# Patient Record
Sex: Female | Born: 1955
Health system: Southern US, Community
[De-identification: ages and names within clinical notes are randomized; demographics above are authoritative.]

## PROBLEM LIST (undated history)

## (undated) DIAGNOSIS — E079 Disorder of thyroid, unspecified: Secondary | ICD-10-CM

## (undated) DIAGNOSIS — L719 Rosacea, unspecified: Secondary | ICD-10-CM

## (undated) HISTORY — PX: OTHER SURGICAL HISTORY: SHX169

## (undated) HISTORY — PX: ECTOPIC PREGNANCY SURGERY: SHX613

## (undated) HISTORY — DX: Rosacea, unspecified: L71.9

---

## 2001-08-16 ENCOUNTER — Ambulatory Visit (HOSPITAL_COMMUNITY): Admission: RE | Admit: 2001-08-16 | Discharge: 2001-08-16 | Payer: Self-pay | Admitting: Specialist

## 2011-01-25 ENCOUNTER — Other Ambulatory Visit: Payer: Self-pay | Admitting: Endocrinology

## 2011-01-25 DIAGNOSIS — E049 Nontoxic goiter, unspecified: Secondary | ICD-10-CM

## 2011-02-02 ENCOUNTER — Inpatient Hospital Stay: Admission: RE | Admit: 2011-02-02 | Payer: Self-pay | Source: Ambulatory Visit

## 2015-04-16 ENCOUNTER — Emergency Department (HOSPITAL_BASED_OUTPATIENT_CLINIC_OR_DEPARTMENT_OTHER)
Admission: EM | Admit: 2015-04-16 | Discharge: 2015-04-16 | Disposition: A | Discharge status: Home / Self Care | Payer: Self-pay | Attending: Emergency Medicine | Admitting: Emergency Medicine

## 2015-04-16 ENCOUNTER — Encounter (HOSPITAL_BASED_OUTPATIENT_CLINIC_OR_DEPARTMENT_OTHER): Payer: Self-pay | Admitting: Emergency Medicine

## 2015-04-16 DIAGNOSIS — S199XXA Unspecified injury of neck, initial encounter: Secondary | ICD-10-CM | POA: Insufficient documentation

## 2015-04-16 DIAGNOSIS — Y998 Other external cause status: Secondary | ICD-10-CM | POA: Insufficient documentation

## 2015-04-16 DIAGNOSIS — S3992XA Unspecified injury of lower back, initial encounter: Secondary | ICD-10-CM | POA: Insufficient documentation

## 2015-04-16 DIAGNOSIS — S4992XA Unspecified injury of left shoulder and upper arm, initial encounter: Secondary | ICD-10-CM | POA: Insufficient documentation

## 2015-04-16 DIAGNOSIS — Y9289 Other specified places as the place of occurrence of the external cause: Secondary | ICD-10-CM | POA: Insufficient documentation

## 2015-04-16 DIAGNOSIS — W010XXA Fall on same level from slipping, tripping and stumbling without subsequent striking against object, initial encounter: Secondary | ICD-10-CM | POA: Insufficient documentation

## 2015-04-16 DIAGNOSIS — S79922A Unspecified injury of left thigh, initial encounter: Secondary | ICD-10-CM | POA: Insufficient documentation

## 2015-04-16 DIAGNOSIS — Y9389 Activity, other specified: Secondary | ICD-10-CM | POA: Insufficient documentation

## 2015-04-16 DIAGNOSIS — M62830 Muscle spasm of back: Secondary | ICD-10-CM

## 2015-04-16 MED ORDER — DIAZEPAM 5 MG PO TABS: 5.0000 mg | ORAL_TABLET | Freq: Three times a day (TID) | ORAL | Status: DC | PRN

## 2015-04-16 NOTE — ED Notes (Signed)
Patient called to state that she was given a prescription with a different name than hers.  Name on chart was Pattricia Bossnnie, with an aka of Drinda Buttsnnette.  Corrected name per registration to [facility] per photo copy of her NCDL.  Reprinted prescription per Dr. Silverio LayYao.

## 2015-04-16 NOTE — ED Notes (Signed)
Patient reports that she she is having back spasms and neck pain post fall on monday

## 2015-04-16 NOTE — ED Provider Notes (Signed)
CSN: [id number]     Arrival date & time 04/16/15  0257 History   First MD Initiated Contact with Patient 04/16/15 [phone]     Chief Complaint  Patient presents with  . Back Pain     (Consider location/radiation/quality/duration/timing/severity/associated sxs/prior Treatment) HPI  This is a 59 year old female who slipped on a wet floor 2 mornings ago. She landed on her bottom. She did not have any significant pain immediately but has subsequently developed pain on the left side of her body. She describes the pain as feeling like muscle spasms which occur in paroxysms. They're located in the left side of her neck and trapezius muscle radiating to her left upper extremity, the left paralumbar region and the left anterior thigh. Symptoms are exacerbated by movement or palpation of the affected muscles. Tylenol initially relieved the pain but is no longer effective. Pain is moderate to severe at its worst.  History reviewed. No pertinent past medical history. History reviewed. No pertinent past surgical history. History reviewed. No pertinent family history. History  Substance Use Topics  . Smoking status: Never Smoker   . Smokeless tobacco: Not on file  . Alcohol Use: No   OB History    No data available     Review of Systems  All other systems reviewed and are negative.   Allergies  Review of patient's allergies indicates no known allergies.  Home Medications   Prior to Admission medications   Not on File   BP 122/88 mmHg  Pulse 77  Temp(Src) 98 F (36.7 C) (Oral)  Resp 20  Ht 5\' 4"  (1.626 m)  Wt 170 lb (77.111 kg)  BMI 29.17 kg/m2  SpO2 100%   Physical Exam  General: Well-developed, well-nourished female in no acute distress; appearance consistent with age of record HENT: normocephalic; atraumatic Eyes: pupils equal, round and reactive to light; extraocular muscles intact Neck: supple; left-sided soft tissue tenderness Heart: regular rate and rhythm Lungs: clear to  auscultation bilaterally Abdomen: soft; nondistended; nontender Back: Left paralumbar tenderness; negative straight leg raise Extremities: No deformity; full range of motion; pulses normal; pain on palpation of left anterior thigh muscles and left trapezius Neurologic: Awake, alert and oriented; motor function intact in all extremities and symmetric; no facial droop Skin: Warm and dry Psychiatric: Normal mood and affect    ED Course  Procedures (including critical care time)   MDM     Paula LibraJohn Babbie Dondlinger, MD 04/16/15 [id number]

## 2015-05-29 ENCOUNTER — Encounter (HOSPITAL_BASED_OUTPATIENT_CLINIC_OR_DEPARTMENT_OTHER): Payer: Self-pay

## 2015-05-29 ENCOUNTER — Emergency Department (HOSPITAL_BASED_OUTPATIENT_CLINIC_OR_DEPARTMENT_OTHER): Payer: Self-pay

## 2015-05-29 ENCOUNTER — Emergency Department (HOSPITAL_BASED_OUTPATIENT_CLINIC_OR_DEPARTMENT_OTHER)
Admission: EM | Admit: 2015-05-29 | Discharge: 2015-05-29 | Disposition: A | Discharge status: Home / Self Care | Payer: Self-pay | Attending: Emergency Medicine | Admitting: Emergency Medicine

## 2015-05-29 DIAGNOSIS — Y92410 Unspecified street and highway as the place of occurrence of the external cause: Secondary | ICD-10-CM | POA: Insufficient documentation

## 2015-05-29 DIAGNOSIS — S39012A Strain of muscle, fascia and tendon of lower back, initial encounter: Secondary | ICD-10-CM | POA: Insufficient documentation

## 2015-05-29 DIAGNOSIS — Y9389 Activity, other specified: Secondary | ICD-10-CM | POA: Insufficient documentation

## 2015-05-29 DIAGNOSIS — Y998 Other external cause status: Secondary | ICD-10-CM | POA: Insufficient documentation

## 2015-05-29 DIAGNOSIS — S199XXA Unspecified injury of neck, initial encounter: Secondary | ICD-10-CM | POA: Insufficient documentation

## 2015-05-29 MED ORDER — IBUPROFEN 800 MG PO TABS
800.0000 mg | ORAL_TABLET | Freq: Once | ORAL | Status: AC
  Administered 2015-05-29: 800 mg via ORAL
  Filled 2015-05-29: qty 1

## 2015-05-29 MED ORDER — IBUPROFEN 800 MG PO TABS: 800.0000 mg | ORAL_TABLET | Freq: Three times a day (TID) | ORAL | Status: DC

## 2015-05-29 MED ORDER — CYCLOBENZAPRINE HCL 10 MG PO TABS
5.0000 mg | ORAL_TABLET | Freq: Once | ORAL | Status: AC
  Administered 2015-05-29: 5 mg via ORAL
  Filled 2015-05-29: qty 1

## 2015-05-29 MED ORDER — CYCLOBENZAPRINE HCL 10 MG PO TABS: 10.0000 mg | ORAL_TABLET | Freq: Two times a day (BID) | ORAL | Status: DC | PRN
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
## 2015-05-29 NOTE — ED Notes (Signed)
MD at bedside. 
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
## 2015-05-29 NOTE — Discharge Instructions (Signed)

## 2015-06-05 ENCOUNTER — Encounter (HOSPITAL_BASED_OUTPATIENT_CLINIC_OR_DEPARTMENT_OTHER): Payer: Self-pay | Admitting: *Deleted

## 2015-06-05 ENCOUNTER — Emergency Department (HOSPITAL_BASED_OUTPATIENT_CLINIC_OR_DEPARTMENT_OTHER)
Admission: EM | Admit: 2015-06-05 | Discharge: 2015-06-05 | Disposition: A | Discharge status: Home / Self Care | Payer: Self-pay | Attending: Emergency Medicine | Admitting: Emergency Medicine

## 2015-06-05 DIAGNOSIS — Z87828 Personal history of other (healed) physical injury and trauma: Secondary | ICD-10-CM | POA: Insufficient documentation

## 2015-06-05 DIAGNOSIS — M25511 Pain in right shoulder: Secondary | ICD-10-CM | POA: Insufficient documentation

## 2015-06-05 DIAGNOSIS — M5432 Sciatica, left side: Secondary | ICD-10-CM | POA: Insufficient documentation

## 2015-06-05 DIAGNOSIS — Z791 Long term (current) use of non-steroidal anti-inflammatories (NSAID): Secondary | ICD-10-CM | POA: Insufficient documentation

## 2015-06-05 MED ORDER — DIAZEPAM 5 MG PO TABS: 5.0000 mg | ORAL_TABLET | Freq: Three times a day (TID) | ORAL | Status: DC | PRN

## 2015-06-05 MED ORDER — OXYCODONE-ACETAMINOPHEN 5-325 MG PO TABS: 1.0000 | ORAL_TABLET | Freq: Four times a day (QID) | ORAL | Status: DC | PRN

## 2015-06-05 NOTE — ED Notes (Signed)
MD at bedside. 
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
## 2015-11-22 ENCOUNTER — Encounter (HOSPITAL_BASED_OUTPATIENT_CLINIC_OR_DEPARTMENT_OTHER): Payer: Self-pay | Admitting: Emergency Medicine

## 2015-11-22 ENCOUNTER — Emergency Department (HOSPITAL_BASED_OUTPATIENT_CLINIC_OR_DEPARTMENT_OTHER)
Admission: EM | Admit: 2015-11-22 | Discharge: 2015-11-23 | Disposition: A | Discharge status: Home / Self Care | Payer: Self-pay | Attending: Emergency Medicine | Admitting: Emergency Medicine

## 2015-11-22 DIAGNOSIS — Z791 Long term (current) use of non-steroidal anti-inflammatories (NSAID): Secondary | ICD-10-CM | POA: Insufficient documentation

## 2015-11-22 DIAGNOSIS — N898 Other specified noninflammatory disorders of vagina: Secondary | ICD-10-CM | POA: Insufficient documentation

## 2015-11-22 LAB — WET PREP, GENITAL
CLUE CELLS WET PREP: NONE SEEN
Trich, Wet Prep: NONE SEEN
Yeast Wet Prep HPF POC: NONE SEEN

## 2015-11-22 LAB — URINALYSIS, ROUTINE W REFLEX MICROSCOPIC
BILIRUBIN URINE: NEGATIVE
Glucose, UA: NEGATIVE mg/dL
Hgb urine dipstick: NEGATIVE
KETONES UR: NEGATIVE mg/dL
Nitrite: NEGATIVE
PH: 6.5 (ref 5.0–8.0)
Protein, ur: NEGATIVE mg/dL
SPECIFIC GRAVITY, URINE: 1.024 (ref 1.005–1.030)

## 2015-11-22 LAB — URINE MICROSCOPIC-ADD ON

## 2015-11-22 MED ORDER — CEFTRIAXONE SODIUM 250 MG IJ SOLR
250.0000 mg | Freq: Once | INTRAMUSCULAR | Status: AC
  Administered 2015-11-22: 250 mg via INTRAMUSCULAR
  Filled 2015-11-22: qty 250

## 2015-11-22 MED ORDER — LIDOCAINE HCL (PF) 1 % IJ SOLN
INTRAMUSCULAR | Status: AC
  Administered 2015-11-22: 5 mL
  Filled 2015-11-22: qty 5

## 2015-11-22 MED ORDER — AZITHROMYCIN 250 MG PO TABS
1000.0000 mg | ORAL_TABLET | Freq: Once | ORAL | Status: AC
  Administered 2015-11-22: 1000 mg via ORAL
  Filled 2015-11-22: qty 4

## 2015-11-22 NOTE — ED Provider Notes (Signed)
CSN: [id number]     Arrival date & time 11/22/15  1934 History   First MD Initiated Contact with Patient 11/22/15 2136     Chief Complaint  Patient presents with  . Vaginal Discharge     (Consider location/radiation/quality/duration/timing/severity/associated sxs/prior Treatment) HPI Comments: Patient presents with complaint of milky vaginal discharge over the past 2 weeks. Patient reports having a new sexual partner. She denies pain, fever, nausea, vomiting, diarrhea. She feels a pressure at times with urination, however denies pain or increased frequency/urgency. No hematuria. No treatment prior to arrival. Patient has noticed a vaginal odor. She is concerned about sexual transmitted disease versus urinary tract infection. Denies other complaints. Onset of symptoms acute. Course is constant. Nothing makes symptoms better or worse.  The history is provided by the patient.    History reviewed. No pertinent past medical history. Past Surgical History  Procedure Laterality Date  . Tubal preg.     History reviewed. No pertinent family history. Social History  Substance Use Topics  . Smoking status: Never Smoker   . Smokeless tobacco: None  . Alcohol Use: No   OB History    No data available     Review of Systems  Constitutional: Negative for fever.  HENT: Negative for sore throat.   Eyes: Negative for discharge.  Gastrointestinal: Negative for rectal pain.  Genitourinary: Positive for dysuria (pressure) and vaginal discharge. Negative for frequency, hematuria, vaginal bleeding, genital sores, pelvic pain and dyspareunia.  Musculoskeletal: Negative for arthralgias.  Skin: Negative for rash.  Hematological: Negative for adenopathy.   Allergies  Review of patient's allergies indicates no known allergies.  Home Medications   Prior to Admission medications   Medication Sig Start Date End Date Taking? Authorizing Provider  cyclobenzaprine (FLEXERIL) 10 MG tablet Take 1 tablet  (10 mg total) by mouth 2 (two) times daily as needed for muscle spasms. 05/29/15   Glynn Octave, MD  diazepam (VALIUM) 5 MG tablet Take 1 tablet (5 mg total) by mouth every 8 (eight) hours as needed for anxiety (spasms). 06/05/15   Purvis Sheffield, MD  ibuprofen (ADVIL,MOTRIN) 800 MG tablet Take 1 tablet (800 mg total) by mouth 3 (three) times daily. 05/29/15   Glynn Octave, MD  oxyCODONE-acetaminophen (PERCOCET) 5-325 MG per tablet Take 1 tablet by mouth every 6 (six) hours as needed. 06/05/15   Purvis Sheffield, MD   BP 143/86 mmHg  Pulse 63  Temp(Src) 97.8 F (36.6 C) (Oral)  Resp 18  Ht 5\' 4"  (1.626 m)  Wt 79.833 kg  BMI 30.20 kg/m2  SpO2 100%   Physical Exam  Constitutional: She appears well-developed and well-nourished.  HENT:  Head: Normocephalic and atraumatic.  Eyes: Conjunctivae are normal. Right eye exhibits no discharge. Left eye exhibits no discharge.  Neck: Normal range of motion. Neck supple.  Cardiovascular: Normal rate, regular rhythm and normal heart sounds.   Pulmonary/Chest: Effort normal and breath sounds normal.  Abdominal: Soft. There is no tenderness.  Genitourinary: There is no rash or tenderness on the right labia. There is no rash or tenderness on the left labia. Uterus is not tender. Cervix exhibits no motion tenderness. Right adnexum displays no mass and no tenderness. Left adnexum displays no mass and no tenderness. No tenderness in the vagina. No signs of injury around the vagina. Vaginal discharge (Milky white) found.  Neurological: She is alert.  Skin: Skin is warm and dry.  Psychiatric: She has a normal mood and affect.  Nursing note and vitals reviewed.  ED Course  Procedures (including critical care time) Labs Review Labs Reviewed  WET PREP, GENITAL - Abnormal; Notable for the following:    WBC, Wet Prep HPF POC MODERATE (*)    All other components within normal limits  URINALYSIS, ROUTINE W REFLEX MICROSCOPIC (NOT AT [facility]) - Abnormal;  Notable for the following:    APPearance CLOUDY (*)    Leukocytes, UA MODERATE (*)    All other components within normal limits  URINE MICROSCOPIC-ADD ON - Abnormal; Notable for the following:    Squamous Epithelial / LPF 6-30 (*)    Bacteria, UA MANY (*)    All other components within normal limits  URINE CULTURE  GC/CHLAMYDIA PROBE AMP ([place]) NOT AT [facility]    Imaging Review No results found. I have personally reviewed and evaluated these images and lab results as part of my medical decision-making.   EKG Interpretation None       10:22 PM Patient seen and examined. Work-up initiated. Medications ordered.   Vital signs reviewed and are as follows: BP 143/86 mmHg  Pulse 63  Temp(Src) 97.8 F (36.6 C) (Oral)  Resp 18  Ht 5\' 4"  (1.626 m)  Wt 79.833 kg  BMI 30.20 kg/m2  SpO2 100%  Pelvic exam performed by Katrinka BlazingSmith PA-S2 under my supervision. Performed with nurse chaperone.   11:04 PM Patient updated on results. Will treat with Rocephin and azithromycin. UA possibly infected but this is not a clean catch. Culture sent. She does not have convincing irritative UTI symptoms, so will withhold antibiotics until culture results.     MDM   Final diagnoses:  Vaginal discharge   Pt with vaginal d/c. New sexual partner. Concern is for cervicitis. No significant pain to suggest PID. No fevers. UA is likely contaminated from vaginal discharge containing WBCs. Treatment as above. Patient appears well, nontoxic. Will discharge to home.    Renne CriglerJoshua Memory Heinrichs, PA-C 11/22/15 2311  Glynn OctaveStephen Rancour, MD 11/22/15 [phone]

## 2015-11-22 NOTE — Discharge Instructions (Signed)
Please read and follow all provided instructions.  Your diagnoses today include:  1. Vaginal discharge     Tests performed today include:  Test for gonorrhea and chlamydia.   Wet prep - only infection fighting cells  Urine culture - pending   Vital signs. See below for your results today.   Medications:  You were treated with azithromycin and rocephin today. These antibiotics treat you for gonorrhea and chlamydia. They do not treat for HIV or syphilis.   Home care instructions:  Read educational materials contained in this packet and follow any instructions provided.   You should tell your partners about your infection and avoid having sex for one week to allow time for the medicine to work.  Sexually transmitted disease testing also available at:   [facility] [place]  [address], [place], phone [phone] or [phone]    Monday - Friday, call for an appointment  [facility] [place]  [address], [place], phone [phone] or [phone]   Monday - Friday, call for an appointment  Return instructions:   Please return to the Emergency Department if you experience worsening symptoms.   Please return if you have any other emergent concerns.  Additional Information:  Your vital signs today were: BP 143/86 mmHg   Pulse 63   Temp(Src) 97.8 F (36.6 C) (Oral)   Resp 18   Ht 5\' 4"  (1.626 m)   Wt 79.833 kg   BMI 30.20 kg/m2   SpO2 100% If your blood pressure (BP) was elevated above 135/85 this visit, please have this repeated by your doctor within one month. --------------

## 2015-11-22 NOTE — ED Notes (Addendum)
[facility] Chaperone present.

## 2015-11-22 NOTE — ED Notes (Signed)
Patient states that she recently started to have sexual intercourse again and since has noticed a strong odor to her urine and a milky d/c to her vaginal secretions.

## 2015-11-22 NOTE — ED Notes (Addendum)
C/o some dysuria, recent resolved hematuria, vaginal d/c, intermittent pelvic and lower back pressure (denies: bleeding, nvd, fever or dizziness). Sx worse with intercourse.

## 2015-11-24 ENCOUNTER — Telehealth (HOSPITAL_BASED_OUTPATIENT_CLINIC_OR_DEPARTMENT_OTHER): Payer: Self-pay | Admitting: Emergency Medicine

## 2015-11-24 LAB — GC/CHLAMYDIA PROBE AMP (~~LOC~~) NOT AT ARMC
Chlamydia: NEGATIVE
Neisseria Gonorrhea: NEGATIVE

## 2015-11-25 LAB — URINE CULTURE: Culture: >=100000

## 2015-11-26 ENCOUNTER — Telehealth (HOSPITAL_COMMUNITY): Payer: Self-pay

## 2015-11-26 NOTE — Progress Notes (Signed)
ED Antimicrobial Stewardship Positive Culture Follow Up   Elizabeth Vaughn is an 60 y.o. female who [facility] with a chief complaint of  Chief Complaint  Patient presents with  . Vaginal Discharge    Recent Results (from the past 720 hour(s))  Urine culture     Status: None   Collection Time: 11/22/15  7:45 PM  Result Value Ref Range Status   Specimen Description URINE, RANDOM  Final   Special Requests NONE  Final   Culture   Final    >=100,000 COLONIES/mL ESCHERICHIA COLI Performed at [facility]    Report Status 11/25/2015 FINAL  Final   Organism ID, Bacteria ESCHERICHIA COLI  Final      Susceptibility   Escherichia coli - MIC*    AMPICILLIN >=32 RESISTANT Resistant     CEFAZOLIN <=4 SENSITIVE Sensitive     CEFTRIAXONE <=1 SENSITIVE Sensitive     CIPROFLOXACIN >=4 RESISTANT Resistant     GENTAMICIN >=16 RESISTANT Resistant     IMIPENEM <=0.25 SENSITIVE Sensitive     NITROFURANTOIN <=16 SENSITIVE Sensitive     TRIMETH/SULFA <=20 SENSITIVE Sensitive     AMPICILLIN/SULBACTAM 16 INTERMEDIATE Intermediate     PIP/TAZO <=4 SENSITIVE Sensitive     * >=100,000 COLONIES/mL ESCHERICHIA COLI  Wet prep, genital     Status: Abnormal   Collection Time: 11/22/15 10:26 PM  Result Value Ref Range Status   Yeast Wet Prep HPF POC NONE SEEN NONE SEEN Final   Trich, Wet Prep NONE SEEN NONE SEEN Final   Clue Cells Wet Prep HPF POC NONE SEEN NONE SEEN Final   WBC, Wet Prep HPF POC MODERATE (A) NONE SEEN Final   Sperm PRESENT  Final    Signs/symptoms most likely not representative of UTI. No treatment--likely asymptomatic bacteriuria.  ED Provider: Alton Revere, PharmD Candidate.

## 2015-11-26 NOTE — Telephone Encounter (Signed)
Post ED Visit - Positive Culture Follow-up  Culture report reviewed by antimicrobial stewardship pharmacist:   Enzo Bi, Pharm.D.  Celedonio Miyamoto, Pharm.D., BCPS  Garvin Fila, Pharm.D.  Georgina Pillion, Pharm.D., BCPS  [place], [address].D., BCPS, AAHIVP  Estella Husk, Pharm.D., BCPS, AAHIVP  Tennis Must, Pharm.D.  Sherle Poe, [address].DJacinto Reap, Pharm.D.  Positive urine culture, >/= 100,000 colonies -> E Coli Chart reviewed by Laveda Norman PA "No treatment"  Arvid Right 11/26/2015, 11:14 AM

## 2016-03-07 ENCOUNTER — Encounter (HOSPITAL_BASED_OUTPATIENT_CLINIC_OR_DEPARTMENT_OTHER): Payer: Self-pay | Admitting: *Deleted

## 2016-03-07 ENCOUNTER — Emergency Department (HOSPITAL_BASED_OUTPATIENT_CLINIC_OR_DEPARTMENT_OTHER): Payer: No Typology Code available for payment source

## 2016-03-07 ENCOUNTER — Emergency Department (HOSPITAL_BASED_OUTPATIENT_CLINIC_OR_DEPARTMENT_OTHER)
Admission: EM | Admit: 2016-03-07 | Discharge: 2016-03-07 | Disposition: A | Discharge status: Home / Self Care | Payer: No Typology Code available for payment source | Attending: Emergency Medicine | Admitting: Emergency Medicine

## 2016-03-07 DIAGNOSIS — S8002XA Contusion of left knee, initial encounter: Secondary | ICD-10-CM | POA: Insufficient documentation

## 2016-03-07 DIAGNOSIS — Y92410 Unspecified street and highway as the place of occurrence of the external cause: Secondary | ICD-10-CM | POA: Insufficient documentation

## 2016-03-07 DIAGNOSIS — H9203 Otalgia, bilateral: Secondary | ICD-10-CM | POA: Insufficient documentation

## 2016-03-07 DIAGNOSIS — R51 Headache: Secondary | ICD-10-CM | POA: Insufficient documentation

## 2016-03-07 DIAGNOSIS — M549 Dorsalgia, unspecified: Secondary | ICD-10-CM | POA: Insufficient documentation

## 2016-03-07 DIAGNOSIS — Y999 Unspecified external cause status: Secondary | ICD-10-CM | POA: Insufficient documentation

## 2016-03-07 DIAGNOSIS — S60012A Contusion of left thumb without damage to nail, initial encounter: Secondary | ICD-10-CM | POA: Insufficient documentation

## 2016-03-07 DIAGNOSIS — Y939 Activity, unspecified: Secondary | ICD-10-CM | POA: Insufficient documentation

## 2016-03-07 DIAGNOSIS — R1084 Generalized abdominal pain: Secondary | ICD-10-CM | POA: Insufficient documentation

## 2016-03-07 DIAGNOSIS — M542 Cervicalgia: Secondary | ICD-10-CM | POA: Insufficient documentation

## 2016-03-07 DIAGNOSIS — S2002XA Contusion of left breast, initial encounter: Secondary | ICD-10-CM | POA: Insufficient documentation

## 2016-03-07 DIAGNOSIS — S8001XA Contusion of right knee, initial encounter: Secondary | ICD-10-CM | POA: Insufficient documentation
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
## 2016-03-07 NOTE — ED Notes (Signed)
Dr. Criss AlvineGoldston in to speak with pt, updated with plan.
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
## 2016-03-07 NOTE — ED Notes (Signed)
Xray results reviewed.
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
## 2016-03-07 NOTE — ED Notes (Signed)
Pt to radiology now. Pt alert, NAD,calm, interactive, resps e/u, speaking in clear complete sentences, no dyspnea noted. Updated.   

## 2016-04-15 ENCOUNTER — Emergency Department (HOSPITAL_BASED_OUTPATIENT_CLINIC_OR_DEPARTMENT_OTHER)
Admission: EM | Admit: 2016-04-15 | Discharge: 2016-04-15 | Disposition: A | Discharge status: Home / Self Care | Payer: No Typology Code available for payment source | Attending: Emergency Medicine | Admitting: Emergency Medicine

## 2016-04-15 ENCOUNTER — Encounter (HOSPITAL_BASED_OUTPATIENT_CLINIC_OR_DEPARTMENT_OTHER): Payer: Self-pay

## 2016-04-15 DIAGNOSIS — M25562 Pain in left knee: Secondary | ICD-10-CM | POA: Diagnosis not present

## 2016-04-15 MED ORDER — IBUPROFEN 600 MG PO TABS: 600.0000 mg | ORAL_TABLET | Freq: Four times a day (QID) | ORAL | Status: DC | PRN

## 2016-04-15 MED ORDER — KETOROLAC TROMETHAMINE 30 MG/ML IJ SOLN
30.0000 mg | Freq: Once | INTRAMUSCULAR | Status: DC
  Filled 2016-04-15: qty 1

## 2016-04-15 MED ORDER — KETOROLAC TROMETHAMINE 30 MG/ML IJ SOLN
30.0000 mg | Freq: Once | INTRAMUSCULAR | Status: AC
  Administered 2016-04-15: 30 mg via INTRAMUSCULAR
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
## 2016-04-15 NOTE — Discharge Instructions (Signed)
Your knee pain is likely still from bruising and inflammation.   The best treatment for this is physical therapy. We are unable to refer you to the physical therapist from the emergency room. You can try calling the [facility] or a physical therapy office that is close to where you live.  If you need a doctor's order you need to get this from a primary care doctor, or you can call the Sports medicine doctor, Dr. Pearletha ForgeHudnall, whose office is located upstairs from here. His information is included.  For your pain you should focus on doing cold compresses/icing 2-3 times a day for at least 20 minutes. Do range of motion exercises for now until you get in with the physical therapist, they will be able to give you more directed exercises. You can continue ibuprofen 400-600mg  3 times a day or tylenol 500-650mg  4 times a day.
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
## 2016-04-19 ENCOUNTER — Encounter: Payer: Self-pay | Admitting: Family Medicine

## 2016-04-19 ENCOUNTER — Ambulatory Visit (INDEPENDENT_AMBULATORY_CARE_PROVIDER_SITE_OTHER): Payer: Self-pay | Admitting: Family Medicine

## 2016-04-19 VITALS — BP 144/86 | HR 84 | Ht 64.0 in | Wt 170.0 lb

## 2016-04-19 DIAGNOSIS — M25562 Pain in left knee: Secondary | ICD-10-CM

## 2016-04-19 DIAGNOSIS — M25561 Pain in right knee: Secondary | ICD-10-CM

## 2016-04-19 MED ORDER — DICLOFENAC SODIUM 75 MG PO TBEC: 75.0000 mg | DELAYED_RELEASE_TABLET | Freq: Two times a day (BID) | ORAL | Status: DC

## 2016-04-19 NOTE — Patient Instructions (Addendum)
This is most consistent with severe knee contusion but it is unusual it's taking so long for you to recover. Icing 15 minutes at a time 3-4 times a day. Diclofenac 75mg  twice a day with food for pain and inflammation. Start physical therapy. Do home exercises on days you don't go to therapy. See work restrictions. Follow up with me in 1 month to 6 weeks. Consider MRI, injection if not improving.

## 2016-04-21 DIAGNOSIS — M25562 Pain in left knee: Secondary | ICD-10-CM

## 2016-04-21 DIAGNOSIS — M25561 Pain in right knee: Secondary | ICD-10-CM | POA: Insufficient documentation
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
## 2016-04-28 ENCOUNTER — Ambulatory Visit: Payer: No Typology Code available for payment source | Attending: Family Medicine | Admitting: Physical Therapy

## 2016-04-28 ENCOUNTER — Ambulatory Visit: Payer: Self-pay

## 2016-04-28 DIAGNOSIS — R2689 Other abnormalities of gait and mobility: Secondary | ICD-10-CM | POA: Insufficient documentation

## 2016-04-28 DIAGNOSIS — R262 Difficulty in walking, not elsewhere classified: Secondary | ICD-10-CM | POA: Diagnosis present

## 2016-04-28 DIAGNOSIS — M25561 Pain in right knee: Secondary | ICD-10-CM | POA: Diagnosis present

## 2016-04-28 DIAGNOSIS — M25661 Stiffness of right knee, not elsewhere classified: Secondary | ICD-10-CM | POA: Diagnosis present

## 2016-04-28 DIAGNOSIS — M25562 Pain in left knee: Secondary | ICD-10-CM | POA: Insufficient documentation

## 2016-04-28 DIAGNOSIS — M25662 Stiffness of left knee, not elsewhere classified: Secondary | ICD-10-CM
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
## 2016-04-30 ENCOUNTER — Ambulatory Visit: Payer: No Typology Code available for payment source

## 2016-05-03 ENCOUNTER — Ambulatory Visit: Payer: No Typology Code available for payment source | Admitting: Physical Therapy

## 2016-05-03 DIAGNOSIS — M25661 Stiffness of right knee, not elsewhere classified: Secondary | ICD-10-CM

## 2016-05-03 DIAGNOSIS — R2689 Other abnormalities of gait and mobility: Secondary | ICD-10-CM

## 2016-05-03 DIAGNOSIS — M25562 Pain in left knee: Secondary | ICD-10-CM | POA: Diagnosis not present

## 2016-05-03 DIAGNOSIS — R262 Difficulty in walking, not elsewhere classified: Secondary | ICD-10-CM

## 2016-05-03 DIAGNOSIS — M25662 Stiffness of left knee, not elsewhere classified: Secondary | ICD-10-CM

## 2016-05-03 DIAGNOSIS — M25561 Pain in right knee: Secondary | ICD-10-CM

## 2016-05-03 NOTE — Patient Instructions (Signed)
Quad Set    With other leg bent, foot flat, slowly tighten muscles on thigh of straight leg while counting out loud to _5___. Repeat with other leg. Repeat _10___ times. Do __1-2__ sessions per day.  http://gt2.exer.us/276   Copyright  VHI. All rights reserved.    Heel Slide    Bend knee and pull heel toward buttocks. Hold _1-2___ seconds. Return. Repeat with other knee. Repeat __10__ times. Do __1-2__ sessions per day.  http://gt2.exer.us/372   Copyright  VHI. All rights reserved.    Straight Leg Raise    Bend one leg. Raise other leg __5-6__ inches with knee locked. Exhale and tighten thigh muscles while raising leg. Repeat with other leg. Repeat __10__ times. Do _1-2___ sessions per day.  http://gt2.exer.us/270   Copyright  VHI. All rights reserved.   Short Arc Dean Foods CompanyQuad    Place a large can or rolled towel under leg. Straighten knee and leg. Hold _1-2___ seconds. Repeat with other leg. Repeat _10___ times. Do _1-2___ sessions per day.  http://gt2.exer.us/366   Copyright  VHI. All rights reserved.
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
## 2016-05-10 ENCOUNTER — Ambulatory Visit: Payer: Self-pay | Attending: Internal Medicine

## 2016-05-10 DIAGNOSIS — M25661 Stiffness of right knee, not elsewhere classified: Secondary | ICD-10-CM | POA: Insufficient documentation

## 2016-05-10 DIAGNOSIS — R262 Difficulty in walking, not elsewhere classified: Secondary | ICD-10-CM | POA: Insufficient documentation

## 2016-05-10 DIAGNOSIS — M25662 Stiffness of left knee, not elsewhere classified: Secondary | ICD-10-CM | POA: Insufficient documentation

## 2016-05-10 DIAGNOSIS — R2689 Other abnormalities of gait and mobility: Secondary | ICD-10-CM | POA: Insufficient documentation

## 2016-05-10 DIAGNOSIS — M25562 Pain in left knee: Secondary | ICD-10-CM | POA: Insufficient documentation

## 2016-05-10 DIAGNOSIS — M25561 Pain in right knee: Secondary | ICD-10-CM | POA: Insufficient documentation

## 2016-05-12 ENCOUNTER — Ambulatory Visit: Payer: Self-pay | Admitting: Physical Therapy

## 2016-05-12 DIAGNOSIS — M25662 Stiffness of left knee, not elsewhere classified: Secondary | ICD-10-CM

## 2016-05-12 DIAGNOSIS — M25562 Pain in left knee: Secondary | ICD-10-CM

## 2016-05-12 DIAGNOSIS — M25661 Stiffness of right knee, not elsewhere classified: Secondary | ICD-10-CM

## 2016-05-12 DIAGNOSIS — R2689 Other abnormalities of gait and mobility: Secondary | ICD-10-CM

## 2016-05-12 DIAGNOSIS — M25561 Pain in right knee: Secondary | ICD-10-CM

## 2016-05-12 DIAGNOSIS — R262 Difficulty in walking, not elsewhere classified: Secondary | ICD-10-CM
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
## 2016-05-17 ENCOUNTER — Ambulatory Visit: Payer: No Typology Code available for payment source

## 2016-05-20 ENCOUNTER — Ambulatory Visit: Payer: No Typology Code available for payment source

## 2016-05-24 ENCOUNTER — Ambulatory Visit: Payer: Self-pay | Admitting: Physical Therapy

## 2016-05-24 ENCOUNTER — Telehealth: Payer: Self-pay | Admitting: Physical Therapy

## 2016-05-24 DIAGNOSIS — R2689 Other abnormalities of gait and mobility: Secondary | ICD-10-CM

## 2016-05-24 DIAGNOSIS — M25561 Pain in right knee: Secondary | ICD-10-CM

## 2016-05-24 DIAGNOSIS — R262 Difficulty in walking, not elsewhere classified: Secondary | ICD-10-CM

## 2016-05-24 DIAGNOSIS — M25562 Pain in left knee: Secondary | ICD-10-CM

## 2016-05-24 DIAGNOSIS — M25662 Stiffness of left knee, not elsewhere classified: Secondary | ICD-10-CM

## 2016-05-24 DIAGNOSIS — M25661 Stiffness of right knee, not elsewhere classified: Secondary | ICD-10-CM
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
## 2016-05-24 NOTE — Patient Instructions (Signed)
Adductor Stretch - Supine    Lie on floor, knees bent, feet flat. Keeping feet together, lower knees toward floor until stretch felt at inner thighs.  Can do left side only.  Hold 30 seconds. Repeat _2-3__ times. Do ___ times per day.  Copyright  VHI. All rights reserved.    Hip Flexor Stretch    Lying on back near edge of bed, bend one leg, foot flat. Hang other leg over edge, relaxed, thigh resting entirely on bed for __30__ seconds. Repeat _2-3___ times. Do _2-3___ sessions per day. Bend knee back keeping thigh in contact with bed to what you can tolerate.  http://gt2.exer.us/347   Copyright  VHI. All rights reserved.    KNEE: Quadriceps - Prone    Place strap around ankle. Bring ankle toward buttocks. Prop knee up on towel roll for added stretch. Hold _30__ seconds. __2-3_ reps per set, _2-3__ sets per day.   Copyright  VHI. All rights reserved.

## 2016-05-24 NOTE — Telephone Encounter (Signed)
Recommend home TENS unit.  If you agree, please order.  Thanks- Clarita CraneStephanie F Ashlan Dignan, PT, DPT 05/24/2016 10:45 AM

## 2016-05-27 ENCOUNTER — Ambulatory Visit: Payer: No Typology Code available for payment source | Admitting: Physical Therapy

## 2016-05-27 DIAGNOSIS — M25562 Pain in left knee: Secondary | ICD-10-CM

## 2016-05-27 DIAGNOSIS — M25662 Stiffness of left knee, not elsewhere classified: Secondary | ICD-10-CM

## 2016-05-27 DIAGNOSIS — R2689 Other abnormalities of gait and mobility: Secondary | ICD-10-CM

## 2016-05-27 DIAGNOSIS — R262 Difficulty in walking, not elsewhere classified: Secondary | ICD-10-CM

## 2016-05-27 DIAGNOSIS — M25561 Pain in right knee: Secondary | ICD-10-CM

## 2016-05-27 DIAGNOSIS — M25661 Stiffness of right knee, not elsewhere classified: Secondary | ICD-10-CM
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
## 2016-05-31 ENCOUNTER — Ambulatory Visit (INDEPENDENT_AMBULATORY_CARE_PROVIDER_SITE_OTHER): Payer: Self-pay | Admitting: Family Medicine

## 2016-05-31 ENCOUNTER — Ambulatory Visit: Payer: Self-pay | Admitting: Physical Therapy

## 2016-05-31 ENCOUNTER — Encounter: Payer: Self-pay | Admitting: Family Medicine

## 2016-05-31 DIAGNOSIS — R2689 Other abnormalities of gait and mobility: Secondary | ICD-10-CM

## 2016-05-31 DIAGNOSIS — M25661 Stiffness of right knee, not elsewhere classified: Secondary | ICD-10-CM

## 2016-05-31 DIAGNOSIS — M25561 Pain in right knee: Secondary | ICD-10-CM

## 2016-05-31 DIAGNOSIS — M25662 Stiffness of left knee, not elsewhere classified: Secondary | ICD-10-CM

## 2016-05-31 DIAGNOSIS — M25562 Pain in left knee: Secondary | ICD-10-CM

## 2016-05-31 DIAGNOSIS — R262 Difficulty in walking, not elsewhere classified: Secondary | ICD-10-CM

## 2016-05-31 MED ORDER — IBUPROFEN 800 MG PO TABS: 800.0000 mg | ORAL_TABLET | Freq: Three times a day (TID) | ORAL | 1 refills | Status: DC | PRN
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
## 2016-05-31 NOTE — Patient Instructions (Signed)
Finish the last visit of physical therapy then do home exercises for 6 more weeks (or until pain has resolved). You have a thenar muscle strain as well in your thumb. Consider thumbkeeper to rest this as much as possible. Ice or heat, whichever feels better, for 15 minutes 3-4 times a day. Ibuprofen or aleve if needed for pain. Follow up with me in 6 weeks for reevaluation.

## 2016-06-02 ENCOUNTER — Ambulatory Visit: Payer: No Typology Code available for payment source | Admitting: Physical Therapy

## 2016-06-02 DIAGNOSIS — R2689 Other abnormalities of gait and mobility: Secondary | ICD-10-CM

## 2016-06-02 DIAGNOSIS — R262 Difficulty in walking, not elsewhere classified: Secondary | ICD-10-CM

## 2016-06-02 DIAGNOSIS — M25662 Stiffness of left knee, not elsewhere classified: Secondary | ICD-10-CM

## 2016-06-02 DIAGNOSIS — M25561 Pain in right knee: Secondary | ICD-10-CM

## 2016-06-02 DIAGNOSIS — M25562 Pain in left knee: Secondary | ICD-10-CM

## 2016-06-02 DIAGNOSIS — M25661 Stiffness of right knee, not elsewhere classified: Secondary | ICD-10-CM

## 2016-06-02 NOTE — Assessment & Plan Note (Signed)
2/2 severe knee contusions (worse on left), possible underlying arthritis flare also.  Icing, ibuprofen or diclofenac as needed.  Home exercises, can d/c physical therapy now.

## 2016-06-02 NOTE — Patient Instructions (Signed)
ABDUCTION: Standing - Resistance Band (Active)   Stand, feet flat. Against blue resistance band, lift right leg out to side.  Repeat with other leg. Complete _1__ sets of _10__ repetitions. Perform _2-3__ sessions per day.   Strengthening: Hip Flexion - Resisted   With tubing around left ankle, anchor behind, bring leg forward, keeping knee straight.  Repeat with other leg. Repeat __10__ times per set. Do __1__ sets per session. Do __2-3__ sessions per day.  Strengthening: Hip Extension - Resisted   With tubing around right ankle, face anchor and pull leg straight back.  Repeat with other leg.. Repeat __10__ times per set. Do _1___ sets per session. Do __2-3__ sessions per day.        At the gym: -Warm up on the bike, treadmill or elliptical for 5-10 min  -Machines you can use: Leg Press (did 35# in clinic), Knee extension (did 20# in clinic), and Hamstring Curls (did 20# in clinic).  You're looking to do 2-3 sets of 10 reps.  Other machines we don't have but you could use: hip abduction and adduction machines and hip extension machine.  You want to feel some muscle fatigue by the end of your sets.  -Cool down with stretching.

## 2016-06-02 NOTE — Therapy (Signed)
[facility] [address]  [place], [place], [address] Phone: [phone]   Fax:  [phone]  Physical Therapy Treatment  Patient Details  Name: Elizabeth Vaughn MRN: [id number] Date of Birth: 1956/10/08 Referring Provider: Dr. Karlton Lemon  Encounter Date: 06/02/2016      PT End of Session - 06/02/16 0754    Visit Number 7   PT Start Time 0725  pt arrived late   PT Stop Time 0754   PT Time Calculation (min) 29 min   Activity Tolerance Patient tolerated treatment well   Behavior During Therapy [facility] for tasks assessed/performed      No past medical history on file.  Past Surgical History:  Procedure Laterality Date  . ECTOPIC PREGNANCY SURGERY    . tubal preg.      There were no vitals filed for this visit.      Subjective Assessment - 06/02/16 1761    Subjective MD appointment went well; pleased with progress.  Returns in 6 weeks to MD and hopeful this is last appointment.   Limitations Standing;Walking;Sitting;House hold activities   Patient Stated Goals improve pain in knees   Currently in Pain? No/denies   Pain Score 0-No pain  "no just that L knee nag there"   Pain Location Knee   Pain Orientation Left   Pain Descriptors / Indicators Nagging            [facility] PT Assessment - 06/02/16 0759      Observation/Other Assessments   Focus on Therapeutic Outcomes (FOTO)  99 (1% limited)     AROM   Right Knee Extension -3   Right Knee Flexion 120   Left Knee Extension 0   Left Knee Flexion 118                     [facility] Adult PT Treatment/Exercise - 06/02/16 6073      Self-Care   Other Self-Care Comments  educated on updated fitness program, recommendations for gym protocol and HEP; pt verbalized understanding     Knee/Hip Exercises: Aerobic   Nustep L5 x 5 min     Knee/Hip Exercises: Machines for Strengthening   Cybex Knee Extension 20# x20   Cybex Knee Flexion 20# x20   Cybex Leg Press 35# x 20     Knee/Hip Exercises: Standing   Hip Flexion Both;10 reps;Knee straight   Hip Flexion Limitations blue theraband   Hip Abduction Both;10 reps;Knee straight   Abduction Limitations blue theraband   Hip Extension Both;10 reps;Knee straight   Extension Limitations blue theraband   Walking with Sports Cord resisted side stepping and backwards walking with blue theraband                PT Education - 06/02/16 0753    Education provided Yes   Education Details HEP and gym exercises   Person(s) Educated Patient   Methods Explanation   Comprehension Verbalized understanding             PT Long Term Goals - 06/02/16 0755      PT LONG TERM GOAL #1   Title independent with HEP (06/09/16)   Status Achieved     PT LONG TERM GOAL #2   Title improve bil knee AROM flexion to 125 for improved mobility (06/09/16)   Baseline improve to L 118 degrees and R 120 degrees   Status Not Met     PT LONG TERM GOAL #  3   Title report ability to walk > 45 min without increase in pain for improved function and mobility (06/09/16)   Status Not Met     PT LONG TERM GOAL #4   Title demonstrate 4/5 strength in bil lower extremities for improved function and mobility (06/09/16)   Status Achieved               Plan - 06/02/16 0755    Clinical Impression Statement Pt is ready for d/c today as she has established HEP and community fitness plan in place.  Bil knee AROM greatly improved (no quite to goal of 125 but functional) and strength improved. Pt reports decreased walking tolerance but knee pain improved overall and ready for d/c.   PT Next Visit Plan d/c PT today   Consulted and Agree with Plan of Care Patient      Patient will benefit from skilled therapeutic intervention in order to improve the following deficits and impairments:  Abnormal gait, Decreased mobility, Difficulty walking, Decreased strength, Pain, Decreased range of motion, Increased edema  Visit  Diagnosis: Pain in left knee  Pain in right knee  Stiffness of left knee, not elsewhere classified  Stiffness of right knee, not elsewhere classified  Other abnormalities of gait and mobility  Difficulty in walking, not elsewhere classified     Problem List Patient Active Problem List   Diagnosis Date Noted  . Bilateral knee pain 04/21/2016   Laureen Abrahams, PT, DPT 06/02/16 7:59 AM  [facility] [address]  [place], [place], [address] Phone: [phone]   Fax:  [phone]  Name: Elizabeth Vaughn MRN: [id number] Date of Birth: 09-May-1956       PHYSICAL THERAPY DISCHARGE SUMMARY  Visits from Start of Care: 7  Current functional level related to goals / functional outcomes: See above   Remaining deficits: Pt c/o some residual pain, but improved with estim and exercises; FOTO score completed by pt reports she has no difficulty with activities.   Education / Equipment: HEP  Plan: Patient agrees to discharge.  Patient goals were partially met. Patient is being discharged due to being pleased with the current functional level.  ?????     Laureen Abrahams, PT, DPT 06/02/16 7:59 AM  [facility] [place], [place] [address]  [phone] (office) [phone] (fax)
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
## 2016-06-09 ENCOUNTER — Ambulatory Visit: Payer: No Typology Code available for payment source

## 2016-07-08 ENCOUNTER — Telehealth: Payer: Self-pay | Admitting: Family Medicine

## 2016-07-09 NOTE — Telephone Encounter (Signed)
I would say if this is still bothering her (the knee or the arm), we should probably see her in the office.

## 2016-07-13 NOTE — Telephone Encounter (Signed)
Appointment made

## 2016-07-14 ENCOUNTER — Ambulatory Visit (INDEPENDENT_AMBULATORY_CARE_PROVIDER_SITE_OTHER): Payer: Self-pay | Admitting: Family Medicine

## 2016-07-14 ENCOUNTER — Encounter: Payer: Self-pay | Admitting: Family Medicine

## 2016-07-14 DIAGNOSIS — M25562 Pain in left knee: Secondary | ICD-10-CM

## 2016-07-14 DIAGNOSIS — M25561 Pain in right knee: Secondary | ICD-10-CM

## 2016-07-14 NOTE — Patient Instructions (Signed)
Do the home exercises for another 4-6 weeks - straight leg raises, knee extensions, half-squats, half-lunges. 3 sets of 10 once a day. Call me if you're having any problems Otherwise we are releasing you today.
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
## 2016-08-05 ENCOUNTER — Encounter: Payer: Self-pay | Admitting: *Deleted

## 2016-08-26 ENCOUNTER — Telehealth: Payer: Self-pay | Admitting: Family Medicine

## 2016-08-26 NOTE — Telephone Encounter (Signed)
In her recent letter that you gave her to restrict hours from July 24th until release.  It doesn't actually say she was written out of work from before July 24th.  So, she needs something stating that she was totally out from working before July 24th if you can, please.  Thank you.

## 2016-08-26 NOTE — Telephone Encounter (Signed)
She wasn't written out of work.  She was written for the hour restrictions starting with the visit on June 12th - there was a letter for that already but it expired July 24th so we wrote her another letter for hours restrictions from that point onwards.

## 2016-11-29 ENCOUNTER — Emergency Department (HOSPITAL_BASED_OUTPATIENT_CLINIC_OR_DEPARTMENT_OTHER)
Admission: EM | Admit: 2016-11-29 | Discharge: 2016-11-29 | Disposition: A | Discharge status: Home / Self Care | Payer: Self-pay | Attending: Emergency Medicine | Admitting: Emergency Medicine

## 2016-11-29 ENCOUNTER — Encounter (HOSPITAL_BASED_OUTPATIENT_CLINIC_OR_DEPARTMENT_OTHER): Payer: Self-pay | Admitting: *Deleted

## 2016-11-29 ENCOUNTER — Emergency Department (HOSPITAL_BASED_OUTPATIENT_CLINIC_OR_DEPARTMENT_OTHER): Payer: Self-pay

## 2016-11-29 DIAGNOSIS — Y999 Unspecified external cause status: Secondary | ICD-10-CM | POA: Insufficient documentation

## 2016-11-29 DIAGNOSIS — Y929 Unspecified place or not applicable: Secondary | ICD-10-CM | POA: Insufficient documentation

## 2016-11-29 DIAGNOSIS — Y939 Activity, unspecified: Secondary | ICD-10-CM | POA: Insufficient documentation

## 2016-11-29 DIAGNOSIS — Z23 Encounter for immunization: Secondary | ICD-10-CM | POA: Insufficient documentation

## 2016-11-29 DIAGNOSIS — S41132A Puncture wound without foreign body of left upper arm, initial encounter: Secondary | ICD-10-CM | POA: Insufficient documentation

## 2016-11-29 DIAGNOSIS — S40812A Abrasion of left upper arm, initial encounter: Secondary | ICD-10-CM

## 2016-11-29 MED ORDER — IBUPROFEN 800 MG PO TABS
800.0000 mg | ORAL_TABLET | Freq: Once | ORAL | Status: AC
  Administered 2016-11-29: 800 mg via ORAL
  Filled 2016-11-29: qty 1

## 2016-11-29 MED ORDER — TETANUS-DIPHTH-ACELL PERTUSSIS 5-2.5-18.5 LF-MCG/0.5 IM SUSP
0.5000 mL | Freq: Once | INTRAMUSCULAR | Status: AC
  Administered 2016-11-29: 0.5 mL via INTRAMUSCULAR
  Filled 2016-11-29: qty 0.5

## 2016-11-29 MED ORDER — BACITRACIN ZINC 500 UNIT/GM EX OINT
TOPICAL_OINTMENT | Freq: Once | CUTANEOUS | Status: AC
  Administered 2016-11-29: 1 via TOPICAL
  Filled 2016-11-29: qty 28.35

## 2016-11-29 MED ORDER — HYDROCODONE-ACETAMINOPHEN 5-325 MG PO TABS
1.0000 | ORAL_TABLET | Freq: Once | ORAL | Status: AC
  Administered 2016-11-29: 1 via ORAL
  Filled 2016-11-29: qty 1

## 2016-11-29 NOTE — ED Notes (Signed)
Pt verbalizes understanding of d/c instructions and denies any further needs at this time. 
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
## 2016-12-06 ENCOUNTER — Emergency Department (HOSPITAL_BASED_OUTPATIENT_CLINIC_OR_DEPARTMENT_OTHER)
Admission: EM | Admit: 2016-12-06 | Discharge: 2016-12-06 | Disposition: A | Discharge status: Home / Self Care | Payer: Self-pay | Attending: Emergency Medicine | Admitting: Emergency Medicine

## 2016-12-06 ENCOUNTER — Encounter (HOSPITAL_BASED_OUTPATIENT_CLINIC_OR_DEPARTMENT_OTHER): Payer: Self-pay | Admitting: *Deleted

## 2016-12-06 DIAGNOSIS — R05 Cough: Secondary | ICD-10-CM | POA: Insufficient documentation

## 2016-12-06 DIAGNOSIS — J111 Influenza due to unidentified influenza virus with other respiratory manifestations: Secondary | ICD-10-CM

## 2016-12-06 DIAGNOSIS — R1110 Vomiting, unspecified: Secondary | ICD-10-CM | POA: Insufficient documentation

## 2016-12-06 DIAGNOSIS — R079 Chest pain, unspecified: Secondary | ICD-10-CM | POA: Insufficient documentation

## 2016-12-06 DIAGNOSIS — R69 Illness, unspecified: Secondary | ICD-10-CM

## 2016-12-06 MED ORDER — ACETAMINOPHEN 500 MG PO TABS
1000.0000 mg | ORAL_TABLET | Freq: Once | ORAL | Status: AC
  Administered 2016-12-06: 1000 mg via ORAL
  Filled 2016-12-06: qty 2

## 2016-12-06 MED ORDER — IBUPROFEN 800 MG PO TABS
800.0000 mg | ORAL_TABLET | Freq: Once | ORAL | Status: AC
  Administered 2016-12-06: 800 mg via ORAL
  Filled 2016-12-06: qty 1

## 2016-12-06 MED ORDER — GUAIFENESIN-CODEINE 100-10 MG/5ML PO SOLN: 10.0000 mL | Freq: Four times a day (QID) | ORAL | 0 refills | Status: DC | PRN

## 2016-12-06 MED ORDER — ONDANSETRON 8 MG PO TBDP
8.0000 mg | ORAL_TABLET | Freq: Once | ORAL | Status: AC
  Administered 2016-12-06: 8 mg via ORAL
  Filled 2016-12-06: qty 1

## 2016-12-06 MED ORDER — GUAIFENESIN-CODEINE 100-10 MG/5ML PO SOLN
10.0000 mL | Freq: Once | ORAL | Status: AC
  Administered 2016-12-06: 10 mL via ORAL
  Filled 2016-12-06: qty 10

## 2016-12-06 MED ORDER — ONDANSETRON 4 MG PO TBDP: 4.0000 mg | ORAL_TABLET | Freq: Three times a day (TID) | ORAL | 0 refills | Status: DC | PRN
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
## 2016-12-06 NOTE — ED Notes (Signed)
Pt discharged to home with friend. NAD.  

## 2016-12-06 NOTE — Discharge Instructions (Signed)
You may alternate between Tylenol 1000 mg every 6 hours as needed for fever and pain and ibuprofen 800 mg every 8 hours as needed for fever and pain. Both of these medications are found over-the-counter. Please rest and drink plenty of fluids. The fluid is a virus. It is not a bacterial illness therefore you do not need antibiotics as they will not be helpful.    To find a primary care or specialty doctor please call [phone] or [phone] to access "[place] Find a Doctor Service."  You may also go on the [facility] website at [email or web address]  There are also multiple [facility], [facility], [facility] and [facility] practices throughout the [place] that are frequently accepting new patients. You may find a clinic that is close to your home and contact them.  [facility] -  [address] [place] [id number] [phone]   [facility] -  [address] [place] [id number] [phone]   [facility] [phone]  [place] [phone]

## 2016-12-06 NOTE — ED Provider Notes (Addendum)
TIME SEEN: 6:00 AM  CHIEF COMPLAINT: Flulike symptoms  HPI: Pt is a 61 y.o. female with previous history of sarcoidosis over 20 years ago who presents emergency department with flulike illness that started 3 days ago. Has had fevers, chills, ear pain, sore throat, cough with green sputum production, body aches. Took Tylenol and Mucinex yesterday but nothing today. Has chest burning with coughing. No other chest pain. No shortness of breath. Has had 2 episodes of vomiting but no diarrhea. No abdominal pain. Friend here being evaluated with similar symptoms.  ROS: See HPI Constitutional:  fever  Eyes: no drainage  ENT: no runny nose   Cardiovascular:   chest pain with coughing Resp: no SOB  GI: vomiting GU: no dysuria Integumentary: no rash  Allergy: no hives  Musculoskeletal: no leg swelling  Neurological: no slurred speech ROS otherwise negative  PAST MEDICAL HISTORY/PAST SURGICAL HISTORY:  History reviewed. No pertinent past medical history.  MEDICATIONS:  Prior to Admission medications   Medication Sig Start Date End Date Taking? Authorizing Provider  ibuprofen (ADVIL,MOTRIN) 800 MG tablet Take 1 tablet (800 mg total) by mouth every 8 (eight) hours as needed. 05/31/16   Lenda KelpShane R Hudnall, MD    ALLERGIES:  Allergies  Allergen Reactions  . Macrodantin [Nitrofurantoin]     SOCIAL HISTORY:  Social History  Substance Use Topics  . Smoking status: Never Smoker  . Smokeless tobacco: Never Used  . Alcohol use No    FAMILY HISTORY: History reviewed. No pertinent family history.  EXAM: BP 114/72 (BP Location: Left Arm)   Pulse 103   Temp 103 F (39.4 C) (Oral)   Resp 20   Ht 5\' 4"  (1.626 m)   Wt 169 lb (76.7 kg)   SpO2 96%   BMI 29.01 kg/m  CONSTITUTIONAL: Alert and oriented and responds appropriately to questions. Well-appearing; well-nourished, Febrile but nontoxic, well-hydrated appearing HEAD: Normocephalic EYES: Conjunctivae clear, PERRL, EOMI ENT: normal nose;  no rhinorrhea; moist mucous membranes; No pharyngeal erythema or petechiae, no tonsillar hypertrophy or exudate, no uvular deviation, no unilateral swelling, no trismus or drooling, no muffled voice, normal phonation, no stridor, no dental caries present, no drainable dental abscess noted, no Ludwig's angina, tongue sits flat in the bottom of the mouth, no angioedema, no facial erythema or warmth, no facial swelling; no pain with movement of the neck; TMs are clear bilaterally without erythema, purulence, bulging, perforation, effusion.  No cerumen impaction or sign of foreign body in the external auditory canal. No inflammation, erythema or drainage from the external auditory canal. No signs of mastoiditis. No pain with manipulation of the pinna bilaterally. NECK: Supple, no meningismus, no nuchal rigidity, no LAD  CARD: Regular minimally tachycardic; S1 and S2 appreciated; no murmurs, no clicks, no rubs, no gallops RESP: Normal chest excursion without splinting or tachypnea; breath sounds clear and equal bilaterally; no wheezes, no rhonchi, no rales, no hypoxia or respiratory distress, speaking full sentences ABD/GI: Normal bowel sounds; non-distended; soft, non-tender, no rebound, no guarding, no peritoneal signs, no hepatosplenomegaly BACK:  The back appears normal and is non-tender to palpation, there is no CVA tenderness EXT: Normal ROM in all joints; non-tender to palpation; no edema; normal capillary refill; no cyanosis, no calf tenderness or swelling    SKIN: Normal color for age and race; warm; no rash NEURO: Moves all extremities equally, sensation to light touch intact diffusely, cranial nerves II through XII intact, normal speech PSYCH: The patient's mood and manner are appropriate. Grooming and  personal hygiene are appropriate.  MEDICAL DECISION MAKING: Patient here with flulike symptoms. She is febrile and minimally tachycardic but otherwise hemodynamically stable and appears well hydrated  and nontoxic. Nothing on exam to suggest meningitis, pneumonia, colitis, diverticulitis, appendicitis. Recommended symptomatically meant with Tylenol, Motrin, Zofran. We'll discharge with within a somewhat with codeine to help with her cough and burning chest pain with coughing. Doubt ACS. Lungs are completely clear and cough areas dry. I do not feel she needs an x-ray at this time as my clinical suspicion for pneumonia is very low. No increased work of breathing. Sats 100% on room air. Have recommended supportive treatment at home. I do not feel she needs antibiotics. Provided with work note. Patient outside of window for Tamiflu.  At this time, I do not feel there is any life-threatening condition present. I have reviewed and discussed all results (EKG, imaging, lab, urine as appropriate) and exam findings with patient/family. I have reviewed nursing notes and appropriate previous records.  I feel the patient is safe to be discharged home without further emergent workup and can continue workup as an outpatient as needed. Discussed usual and customary return precautions. Patient/family verbalize understanding and are comfortable with this plan.  Outpatient follow-up has been provided. All questions have been answered.      Elizabeth Maw Honesty Menta, DO 12/06/16 0622    Elizabeth Maw Lakesha Levinson, DO 12/06/16 1610

## 2016-12-06 NOTE — ED Notes (Signed)
Dr. Ward at BS.
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
## 2017-12-22 ENCOUNTER — Other Ambulatory Visit: Payer: Self-pay

## 2017-12-22 ENCOUNTER — Emergency Department (HOSPITAL_BASED_OUTPATIENT_CLINIC_OR_DEPARTMENT_OTHER)
Admission: EM | Admit: 2017-12-22 | Discharge: 2017-12-23 | Disposition: A | Discharge status: Home / Self Care | Payer: BLUE CROSS/BLUE SHIELD | Attending: Emergency Medicine | Admitting: Emergency Medicine

## 2017-12-22 ENCOUNTER — Encounter (HOSPITAL_BASED_OUTPATIENT_CLINIC_OR_DEPARTMENT_OTHER): Payer: Self-pay

## 2017-12-22 DIAGNOSIS — N3091 Cystitis, unspecified with hematuria: Secondary | ICD-10-CM | POA: Insufficient documentation

## 2017-12-22 DIAGNOSIS — R319 Hematuria, unspecified: Secondary | ICD-10-CM | POA: Diagnosis present

## 2017-12-22 LAB — URINALYSIS, ROUTINE W REFLEX MICROSCOPIC
Bilirubin Urine: NEGATIVE
Glucose, UA: NEGATIVE mg/dL
Ketones, ur: NEGATIVE mg/dL
NITRITE: NEGATIVE
PH: 6 (ref 5.0–8.0)
Protein, ur: 30 mg/dL — AB
SPECIFIC GRAVITY, URINE: 1.015 (ref 1.005–1.030)

## 2017-12-22 LAB — URINALYSIS, MICROSCOPIC (REFLEX)

## 2017-12-22 MED ORDER — CEPHALEXIN 250 MG PO CAPS
1000.0000 mg | ORAL_CAPSULE | Freq: Once | ORAL | Status: AC
  Administered 2017-12-23: 1000 mg via ORAL
  Filled 2017-12-22: qty 4

## 2017-12-22 MED ORDER — PHENAZOPYRIDINE HCL 200 MG PO TABS: 200.0000 mg | ORAL_TABLET | Freq: Three times a day (TID) | ORAL | 0 refills | Status: DC

## 2017-12-22 MED ORDER — CEPHALEXIN 500 MG PO CAPS: 500.0000 mg | ORAL_CAPSULE | Freq: Two times a day (BID) | ORAL | 0 refills | Status: DC

## 2017-12-22 MED ORDER — PHENAZOPYRIDINE HCL 100 MG PO TABS
200.0000 mg | ORAL_TABLET | Freq: Once | ORAL | Status: AC
  Administered 2017-12-23: 200 mg via ORAL
  Filled 2017-12-22: qty 2

## 2017-12-22 NOTE — ED Triage Notes (Signed)
C/o hematuria, urinary freq x today-NAD-steady gait

## 2017-12-22 NOTE — ED Notes (Signed)
C/o blood in urine, increased urination lower abd and back pain x 1 day  "I think I have a uti"

## 2017-12-22 NOTE — ED Provider Notes (Signed)
[facility] Provider Note: [name] Aariana Shankland, MD, FACEP  CSN: [id number] MRN: [id number] ARRIVAL: 12/22/17 at 2011 ROOM: MH07/MH07   CHIEF COMPLAINT  Hematuria   HISTORY OF PRESENT ILLNESS  12/22/17 11:46 PM Elizabeth Vaughn is a 62 y.o. female with a 2-day history of dysuria.  She describes this is burning with urination which she rates as 7-8 out of 10.  She is also having urinary frequency and voiding of small amounts.  This evening she noticed gross hematuria.  She has had a low-grade fever, noted to be 99.6 on arrival.  She denies chills, nausea, vomiting or diarrhea.  She has had right-sided low back pain.   History reviewed. No pertinent past medical history.  Past Surgical History:  Procedure Laterality Date  . ECTOPIC PREGNANCY SURGERY    . tubal preg.      No family history on file.  Social History   Tobacco Use  . Smoking status: Never Smoker  . Smokeless tobacco: Never Used  Substance Use Topics  . Alcohol use: No    Alcohol/week: 0.0 oz  . Drug use: No    Prior to Admission medications   Not on File    Allergies Macrodantin [nitrofurantoin]   REVIEW OF SYSTEMS  Negative except as noted here or in the History of Present Illness.   PHYSICAL EXAMINATION  Initial Vital Signs Blood pressure 124/83, pulse 84, temperature 99.6 F (37.6 C), temperature source Oral, resp. rate 16, height 5\' 4"  (1.626 m), weight 85.3 kg (188 lb), SpO2 99 %.  Examination General: Well-developed, well-nourished female in no acute distress; appearance consistent with age of record HENT: normocephalic; atraumatic Eyes: pupils equal, round and reactive to light; extraocular muscles intact Neck: supple Heart: regular rate and rhythm Lungs: clear to auscultation bilaterally Abdomen: soft; nondistended; suprapubic tenderness; bowel sounds present GU: No CVA tenderness Extremities: No deformity; full range of motion; pulses normal Neurologic: Awake, alert and  oriented; motor function intact in all extremities and symmetric; no facial droop Skin: Warm and dry Psychiatric: Flat affect   RESULTS  Summary of this visit's results, reviewed by myself:   EKG Interpretation  Date/Time:    Ventricular Rate:    PR Interval:    QRS Duration:   QT Interval:    QTC Calculation:   R Axis:     Text Interpretation:        Laboratory Studies: Results for orders placed or performed during the hospital encounter of 12/22/17 (from the past 24 hour(s))  Urinalysis, Routine w reflex microscopic     Status: Abnormal   Collection Time: 12/22/17  8:23 PM  Result Value Ref Range   Color, Urine YELLOW YELLOW   APPearance CLOUDY (A) CLEAR   Specific Gravity, Urine 1.015 1.005 - 1.030   pH 6.0 5.0 - 8.0   Glucose, UA NEGATIVE NEGATIVE mg/dL   Hgb urine dipstick LARGE (A) NEGATIVE   Bilirubin Urine NEGATIVE NEGATIVE   Ketones, ur NEGATIVE NEGATIVE mg/dL   Protein, ur 30 (A) NEGATIVE mg/dL   Nitrite NEGATIVE NEGATIVE   Leukocytes, UA MODERATE (A) NEGATIVE  Urinalysis, Microscopic (reflex)     Status: Abnormal   Collection Time: 12/22/17  8:23 PM  Result Value Ref Range   RBC / HPF 6-30 0 - 5 RBC/hpf   WBC, UA TOO NUMEROUS TO COUNT 0 - 5 WBC/hpf   Bacteria, UA FEW (A) NONE SEEN   Squamous Epithelial / LPF 0-5 (A) NONE SEEN   Imaging Studies: No  results found.  ED COURSE  Nursing notes and initial vitals signs, including pulse oximetry, reviewed.  Vitals:   12/22/17 2026  BP: 124/83  Pulse: 84  Resp: 16  Temp: 99.6 F (37.6 C)  TempSrc: Oral  SpO2: 99%  Weight: 85.3 kg (188 lb)  Height: 5\' 4"  (1.626 m)   History and urinalysis consistent with hemorrhagic cystitis.  PROCEDURES    ED DIAGNOSES     ICD-10-CM   1. Hemorrhagic cystitis N30.91        Cathy Crounse, Jonny Ruiz, MD 12/22/17 [phone]

## 2017-12-24 LAB — URINE CULTURE: Culture: NO GROWTH

## 2018-01-30 ENCOUNTER — Ambulatory Visit (INDEPENDENT_AMBULATORY_CARE_PROVIDER_SITE_OTHER): Payer: BLUE CROSS/BLUE SHIELD | Admitting: Medical

## 2018-01-30 ENCOUNTER — Encounter: Payer: Self-pay | Admitting: Medical

## 2018-01-30 ENCOUNTER — Encounter: Payer: Self-pay | Admitting: Family Medicine

## 2018-01-30 ENCOUNTER — Ambulatory Visit (INDEPENDENT_AMBULATORY_CARE_PROVIDER_SITE_OTHER): Payer: BLUE CROSS/BLUE SHIELD | Admitting: Family Medicine

## 2018-01-30 ENCOUNTER — Telehealth: Payer: Self-pay | Admitting: Medical

## 2018-01-30 VITALS — BP 111/77 | HR 84 | Ht 64.0 in | Wt 185.0 lb

## 2018-01-30 DIAGNOSIS — R5383 Other fatigue: Secondary | ICD-10-CM

## 2018-01-30 DIAGNOSIS — Z01419 Encounter for gynecological examination (general) (routine) without abnormal findings: Secondary | ICD-10-CM

## 2018-01-30 DIAGNOSIS — Z113 Encounter for screening for infections with a predominantly sexual mode of transmission: Secondary | ICD-10-CM

## 2018-01-30 DIAGNOSIS — Z124 Encounter for screening for malignant neoplasm of cervix: Secondary | ICD-10-CM | POA: Diagnosis not present

## 2018-01-30 DIAGNOSIS — Z1151 Encounter for screening for human papillomavirus (HPV): Secondary | ICD-10-CM | POA: Diagnosis not present

## 2018-01-30 NOTE — Telephone Encounter (Signed)
Patient came on 01/30/2018 for a New Patient appointment, [facility], patient had a 20.00 copay that was collected at the same time. Patient [facility] Patient was upset and Ramon Dredgedward requested that money be refunded. Patient was refunded and a receipt provided. Patient came to the front and was refunded. Patient stts when she made the appointment she was informed by Fleeta EmmerShanea that she would be able to have a new patient appointment and cpe done together on the same day which was 01-30-2018. Patient stts she also didn't want to see a PA. Patient stts that she wanted to see a doctor. Sech a new appointment with Carmelia RollerWendling, however Per Ramon DredgeEdward patient has been dismissed. Patient would need a call to informed she is longer being seen by any providers here.Ramon Dredgedward spoke to me and Selena BattenKim regarding this situation, since [facility]. Per Selena BattenKim was adv to route to office manger she could handle this directly with patient.

## 2018-01-30 NOTE — Patient Instructions (Signed)

## 2018-01-30 NOTE — Progress Notes (Signed)
  Patient complaining of fatigue. Armandina StammerJennifer Ciarah Peace RNBSN

## 2018-01-30 NOTE — Progress Notes (Signed)
GYNECOLOGY ANNUAL PREVENTATIVE CARE ENCOUNTER NOTE  Subjective:   Elizabeth Vaughn is a 62 y.o. [phone] female here for a routine annual gynecologic exam.  Current complaints: fatigue. Works 60 hours a week, plus housework, etc. Has had weight gain. No menopausal symptoms.   Denies abnormal vaginal bleeding, discharge, pelvic pain, problems with intercourse or other gynecologic concerns.    Gynecologic History No LMP recorded. Patient is postmenopausal. Patient is sexually active  Contraception: post menopausal status Last Pap: unsure. Results were: normal Last mammogram: never.  Obstetric History OB History  Gravida Para Term Preterm AB Living  6 4 4   2     SAB TAB Ectopic Multiple Live Births  1   1   4     # Outcome Date GA Lbr Len/2nd Weight Sex Delivery Anes PTL Lv  6 Term           5 Term           4 Term           3 Term           2 Ectopic           1 SAB             Past Medical History:  Diagnosis Date  . Rosacea     Past Surgical History:  Procedure Laterality Date  . ECTOPIC PREGNANCY SURGERY    . tubal preg.      Current Outpatient Medications on File Prior to Visit  Medication Sig Dispense Refill  . cephALEXin (KEFLEX) 500 MG capsule Take 1 capsule (500 mg total) by mouth 2 (two) times daily. 10 capsule 0  . phenazopyridine (PYRIDIUM) 200 MG tablet Take 1 tablet (200 mg total) by mouth 3 (three) times daily. 6 tablet 0   No current facility-administered medications on file prior to visit.     Allergies  Allergen Reactions  . Macrodantin [Nitrofurantoin]     Social History   Socioeconomic History  . Marital status: Divorced    Spouse name: Not on file  . Number of children: Not on file  . Years of education: Not on file  . Highest education level: Not on file  Occupational History  . Not on file  Social Needs  . Financial resource strain: Not on file  . Food insecurity:    Worry: Not on file    Inability: Not on file  . Transportation  needs:    Medical: Not on file    Non-medical: Not on file  Tobacco Use  . Smoking status: Never Smoker  . Smokeless tobacco: Never Used  Substance and Sexual Activity  . Alcohol use: No    Alcohol/week: 0.0 oz  . Drug use: No  . Sexual activity: Not on file  Lifestyle  . Physical activity:    Days per week: Not on file    Minutes per session: Not on file  . Stress: Not on file  Relationships  . Social connections:    Talks on phone: Not on file    Gets together: Not on file    Attends religious service: Not on file    Active member of club or organization: Not on file    Attends meetings of clubs or organizations: Not on file    Relationship status: Not on file  . Intimate partner violence:    Fear of current or ex partner: Not on file    Emotionally abused: Not on file  Physically abused: Not on file    Forced sexual activity: Not on file  Other Topics Concern  . Not on file  Social History Narrative  . Not on file    Family History  Problem Relation Age of Onset  . Diabetes Paternal Grandfather   . Diabetes Maternal Grandfather   . Hypertension Father   . Cancer Mother   . Breast cancer Mother [id number]  . Diabetes Son   . Diabetes Daughter     The following portions of the patient's history were reviewed and updated as appropriate: allergies, current medications, past family history, past medical history, past social history, past surgical history and problem list.  Review of Systems Pertinent items noted in HPI and remainder of comprehensive ROS otherwise negative.   Objective:  BP 111/77 (BP Location: Right Arm)   Pulse 84   Ht 5\' 4"  (1.626 m)   Wt 185 lb (83.9 kg)   BMI 31.76 kg/m  CONSTITUTIONAL: Well-developed, well-nourished female in no acute distress.  HENT:  Normocephalic, atraumatic, External right and left ear normal. Oropharynx is clear and moist EYES: Conjunctivae and EOM are normal. Pupils are equal, round, and reactive to light. No scleral  icterus.  NECK: Normal range of motion, supple, no masses.  Normal thyroid.   CARDIOVASCULAR: Normal heart rate noted, regular rhythm RESPIRATORY: Clear to auscultation bilaterally. Effort and breath sounds normal, no problems with respiration noted. BREASTS: Symmetric in size. No masses, skin changes, nipple drainage, or lymphadenopathy. ABDOMEN: Soft, normal bowel sounds, no distention noted.  No tenderness, rebound or guarding.  PELVIC: Normal appearing external genitalia; normal appearing vaginal mucosa and cervix.  No abnormal discharge noted.  Pap smear obtained.  Normal uterine size, no other palpable masses, no uterine or adnexal tenderness. MUSCULOSKELETAL: Normal range of motion. No tenderness.  No cyanosis, clubbing, or edema.  2+ distal pulses. SKIN: Skin is warm and dry. No rash noted. Not diaphoretic. No erythema. No pallor. NEUROLOGIC: Alert and oriented to person, place, and time. Normal reflexes, muscle tone coordination. No cranial nerve deficit noted. PSYCHIATRIC: Normal mood and affect. Normal behavior. Normal judgment and thought content.  Assessment:  Annual gynecologic examination with pap smear   Plan:  1. Well woman exam with routine gynecological exam I discussed mammogram with patient - patient declines. Discussed early detection improves outcomes, especially with mother dying from breast cancer. Patient still declines. No worrisome lumps on exam. - TSH - Comprehensive metabolic panel - CBC - HIV antibody (with reflex) - RPR - Hepatitis C Antibody - Cytology - PAP  2. Fatigue, unspecified type Most likely work related - pt works Web designer12hrs a day x 5 days. Check TSH, CBC, CMP. Patient to establish with PCP later today. They may choose to add on to this.   Routine preventative health maintenance measures emphasized. Please refer to After Visit Summary for other counseling recommendations.    Candelaria CelesteJacob Delonna Ney, DO [facility]

## 2018-01-30 NOTE — Progress Notes (Addendum)
   Subjective:    Patient ID: Elizabeth Vaughn, female    DOB: 10/26/1956, 62 y.o.   MRN: [id number]  HPI  Patient came in today for new patient visit.  Before I got into the room apparently she was telling medical assistant Leavy CellaJasmine that she is here for complete physical and if she cannot get that done that she will never come back.  I was not aware of patient's blunt statement regarding the fact that she was essentially demanding physical exam or else.   When I began the interview to find out to discuss  medical history she quickly stated that she wanted a physical exam or else she would leave.  I tried to explain to her that seeing a new patient and doing a complete physical exam at the same time it is extremely difficult and would likely set me back about 30 minutes or more on seeing the remainder of my afternoon patients.(Also it is customary in our office that we rarely do complete physical on the first visit.  If we do that then we are typically asked by receptionist asked if that is okay before pt is scheduled.)  She was not understanding and essentially stated that if she could not get her physical then she wanted her money back.  I went and talked with front office staff to see if she can get her money back.  I tried to find Selena BattenKim front office manage and [place] Main office manager but they were not available.  So I got front office staff to give patients money back.  As she expressed that she did want the physical exam today or else.  In fact she indicated that she probably would not come back to the office for any other reason.   Note I did review patient's chart after hours and saw that she actually had a well woman exam with GYN  Earlier today with labs that included TSH, CMP, CBC , HIV and RPR.  As well as hepatitis C antibody.  Physical exam was quite extensive as well.  I did not get the opportunity to look at her GYN note until later as shortly into the interview she was demanding a  CPE/wellness exam as if she had not had one recently.  Exam done by gynecologist and appeared to be quite extensive both in the complete in terms of labs and the actual physical.             Review of Systems     Objective:   Physical Exam         Assessment & Plan:

## 2018-01-31 LAB — CBC
HEMOGLOBIN: 13.2 g/dL (ref 11.1–15.9)
Hematocrit: 39.4 % (ref 34.0–46.6)
MCH: 30.1 pg (ref 26.6–33.0)
MCHC: 33.5 g/dL (ref 31.5–35.7)
MCV: 90 fL (ref 79–97)
Platelets: 307 10*3/uL (ref 150–379)
RBC: 4.39 x10E6/uL (ref 3.77–5.28)
RDW: 14.8 % (ref 12.3–15.4)
WBC: 7.7 10*3/uL (ref 3.4–10.8)

## 2018-01-31 LAB — HIV ANTIBODY (ROUTINE TESTING W REFLEX): HIV SCREEN 4TH GENERATION: NONREACTIVE

## 2018-01-31 LAB — COMPREHENSIVE METABOLIC PANEL
A/G RATIO: 1.2 (ref 1.2–2.2)
ALT: 19 IU/L (ref 0–32)
AST: 46 IU/L — AB (ref 0–40)
Albumin: 4.1 g/dL (ref 3.6–4.8)
Alkaline Phosphatase: 98 IU/L (ref 39–117)
BUN/Creatinine Ratio: 14 (ref 12–28)
BUN: 11 mg/dL (ref 8–27)
Bilirubin Total: 0.4 mg/dL (ref 0.0–1.2)
CALCIUM: 9.2 mg/dL (ref 8.7–10.3)
CO2: 23 mmol/L (ref 20–29)
Chloride: 103 mmol/L (ref 96–106)
Creatinine, Ser: 0.8 mg/dL (ref 0.57–1.00)
GFR, EST AFRICAN AMERICAN: 92 mL/min/{1.73_m2} (ref >59)
GFR, EST NON AFRICAN AMERICAN: 80 mL/min/{1.73_m2} (ref >59)
GLUCOSE: 82 mg/dL (ref 65–99)
Globulin, Total: 3.3 g/dL (ref 1.5–4.5)
POTASSIUM: 4.7 mmol/L (ref 3.5–5.2)
Sodium: 141 mmol/L (ref 134–144)
TOTAL PROTEIN: 7.4 g/dL (ref 6.0–8.5)

## 2018-01-31 LAB — TSH: TSH: 4.43 u[IU]/mL (ref 0.450–4.500)

## 2018-01-31 LAB — HEPATITIS C ANTIBODY: Hep C Virus Ab: <0.1 s/co ratio (ref 0.0–0.9)

## 2018-01-31 LAB — RPR: RPR: NONREACTIVE

## 2018-02-01 ENCOUNTER — Telehealth: Payer: Self-pay

## 2018-02-01 NOTE — Telephone Encounter (Signed)
-----   Message from Levie HeritageJacob J Stinson, DO sent at 02/01/2018  9:22 AM EDT ----- Labs normal - TSH high normal. This will need to be followed closely by PCP. Please let pt know.

## 2018-02-01 NOTE — Telephone Encounter (Signed)
Patient called and made aware that all lab were within normal range. Explained that her TSH was on the higher side of normal and she will want to let her PCP know at her next appointment. Patient states understanding and states she has appt in April.  Elizabeth StammerJennifer Kimiyah Blick RN

## 2018-02-03 LAB — CYTOLOGY - PAP
Chlamydia: NEGATIVE
Diagnosis: NEGATIVE
HPV (WINDOPATH): NOT DETECTED
NEISSERIA GONORRHEA: NEGATIVE

## 2018-02-20 ENCOUNTER — Ambulatory Visit: Payer: BLUE CROSS/BLUE SHIELD | Admitting: Family Medicine

## 2018-06-11 ENCOUNTER — Emergency Department (INDEPENDENT_AMBULATORY_CARE_PROVIDER_SITE_OTHER)
Admission: EM | Admit: 2018-06-11 | Discharge: 2018-06-11 | Disposition: A | Discharge status: Home / Self Care | Payer: BLUE CROSS/BLUE SHIELD | Source: Home / Self Care | Attending: Emergency Medicine | Admitting: Emergency Medicine

## 2018-06-11 ENCOUNTER — Encounter: Payer: Self-pay | Admitting: Emergency Medicine

## 2018-06-11 DIAGNOSIS — M792 Neuralgia and neuritis, unspecified: Secondary | ICD-10-CM

## 2018-06-11 DIAGNOSIS — M25562 Pain in left knee: Secondary | ICD-10-CM

## 2018-06-11 MED ORDER — PREDNISONE 50 MG PO TABS: ORAL_TABLET | ORAL | 0 refills | Status: DC

## 2018-06-11 MED ORDER — KETOROLAC TROMETHAMINE 60 MG/2ML IM SOLN
60.0000 mg | Freq: Once | INTRAMUSCULAR | Status: AC
  Administered 2018-06-11: 60 mg via INTRAMUSCULAR

## 2018-06-11 MED ORDER — MELOXICAM 7.5 MG PO TABS: ORAL_TABLET | ORAL | 0 refills | Status: DC

## 2018-06-11 MED ORDER — HYDROCODONE-ACETAMINOPHEN 5-325 MG PO TABS: 1.0000 | ORAL_TABLET | ORAL | 0 refills | Status: DC | PRN

## 2018-06-11 NOTE — Discharge Instructions (Addendum)
Today, we are giving you a shot of Toradol for pain. Also, prescriptions for meloxicam for moderate pain and prescription for Vicodin if needed for severe pain. Also, prescription for prednisone for nerve pain/neuralgia Wear Ace bandage. Put pillow behind left knee or between your knees when lying in bed. Keep left knee elevated. Excused from work until Wednesday. If not improved by Tuesday, you need to follow-up with your doctor or with an orthopedist. If any severe worsening symptoms in the meantime, go to emergency room.
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
## 2018-06-12 ENCOUNTER — Encounter: Payer: Self-pay | Admitting: Emergency Medicine
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
## 2018-09-09 ENCOUNTER — Emergency Department (INDEPENDENT_AMBULATORY_CARE_PROVIDER_SITE_OTHER)
Admission: EM | Admit: 2018-09-09 | Discharge: 2018-09-09 | Disposition: A | Discharge status: Home / Self Care | Payer: Self-pay | Source: Home / Self Care | Attending: Family Medicine | Admitting: Family Medicine

## 2018-09-09 ENCOUNTER — Emergency Department (INDEPENDENT_AMBULATORY_CARE_PROVIDER_SITE_OTHER): Payer: Self-pay

## 2018-09-09 ENCOUNTER — Encounter: Payer: Self-pay | Admitting: Emergency Medicine

## 2018-09-09 DIAGNOSIS — R0781 Pleurodynia: Secondary | ICD-10-CM

## 2018-09-09 DIAGNOSIS — M25552 Pain in left hip: Secondary | ICD-10-CM

## 2018-09-09 DIAGNOSIS — R52 Pain, unspecified: Secondary | ICD-10-CM

## 2018-09-09 DIAGNOSIS — W010XXA Fall on same level from slipping, tripping and stumbling without subsequent striking against object, initial encounter: Secondary | ICD-10-CM

## 2018-09-09 MED ORDER — IBUPROFEN 600 MG PO TABS: 600.0000 mg | ORAL_TABLET | Freq: Four times a day (QID) | ORAL | 0 refills | Status: DC | PRN

## 2018-09-09 MED ORDER — CYCLOBENZAPRINE HCL 5 MG PO TABS: 5.0000 mg | ORAL_TABLET | Freq: Two times a day (BID) | ORAL | 0 refills | Status: DC | PRN

## 2018-09-09 NOTE — Discharge Instructions (Signed)
°  Flexeril (cyclobenzaprine) is a muscle relaxer and may cause drowsiness. Do not drink alcohol, drive, or operate heavy machinery while taking.  You may take 500mg  acetaminophen every 4-6 hours or in combination with ibuprofen 400-600mg  every 6-8 hours as needed for pain and inflammation.  Please call to schedule a follow up appointment with family medicine or sports medicine if not improving in 1 week.

## 2018-09-09 NOTE — ED Provider Notes (Signed)
[facility] Vaughn [facility]    CSN: [id number] Arrival date & time: 09/09/18  1610     History   Chief Complaint Chief Complaint  Patient presents with  . Fall    HPI Elizabeth Vaughn is a 62 y.o. female.   HPI  Elizabeth Vaughn is a 62 y.o. female presenting to UC with c/o Left side body pain that started around 2AM when she tripped and fell on a rug at a gaming/prize facility early this morning. Pt denies hitting her head or LOC.  Pt landed on her back and Left side on cement floor. Pain is slightly worse in her ribs and hip.  Denies numbness or tingling in arms or legs. No change in bowel or bladder habits. She took Motrin around 12PM.  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    Past Medical History:  Diagnosis Date  . Rosacea     Patient Active Problem List   Diagnosis Date Noted  . Bilateral knee pain 04/21/2016    Past Surgical History:  Procedure Laterality Date  . ECTOPIC PREGNANCY SURGERY    . tubal preg.      OB History    Gravida  6   Para  4   Term  4   Preterm      AB  2   Living        SAB  1   TAB      Ectopic  1   Multiple      Live Births  4            Home Medications    Prior to Admission medications   Medication Sig Start Date End Date Taking? Authorizing Provider  cyclobenzaprine (FLEXERIL) 5 MG tablet Take 1-2 tablets (5-10 mg total) by mouth 2 (two) times daily as needed for muscle spasms. 09/09/18   Lurene Shadow, PA-C  HYDROcodone-acetaminophen (NORCO/VICODIN) 5-325 MG tablet Take 1-2 tablets by mouth every 4 (four) hours as needed for severe pain. Take with food. 06/11/18   Lajean Manes, MD  ibuprofen (ADVIL,MOTRIN) 600 MG tablet Take 1 tablet (600 mg total) by mouth every 6 (six) hours as needed. 09/09/18   Lurene Shadow, PA-C  meloxicam (MOBIC) 7.5 MG tablet Take 1 twice a day as needed for pain. Take with food. (Do not take with any other  NSAID.) 06/11/18   Lajean Manes, MD  predniSONE (DELTASONE) 50 MG tablet Take 1 daily with a meal for 5 days 06/11/18   Lajean Manes, MD    Family History Family History  Problem Relation Age of Onset  . Diabetes Paternal Grandfather   . Diabetes Maternal Grandfather   . Hypertension Father   . Cancer Mother   . Breast cancer Mother 75  . Diabetes Son   . Diabetes Daughter     Social History Social History   Tobacco Use  . Smoking status: Never Smoker  . Smokeless tobacco: Never Used  Substance Use Topics  . Alcohol use: No    Alcohol/week: 0.0 standard drinks  . Drug use: No     Allergies   Macrodantin [nitrofurantoin]   Review of Systems Review of Systems  Cardiovascular: Negative for chest pain and palpitations.  Musculoskeletal: Positive for arthralgias, back pain and myalgias. Negative for joint swelling, neck pain and neck stiffness.  Skin: Negative for color change and wound.  Neurological: Negative for  dizziness, weakness, light-headedness, numbness and headaches.     Physical Exam Triage Vital Signs ED Triage Vitals  Enc Vitals Group     BP 09/09/18 1642 (!) 144/90     Pulse Rate 09/09/18 1642 80     Resp --      Temp 09/09/18 1642 98.3 F (36.8 C)     Temp Source 09/09/18 1642 Oral     SpO2 09/09/18 1642 98 %     Weight 09/09/18 1643 180 lb (81.6 kg)     Height --      Head Circumference --      Peak Flow --      Pain Score 09/09/18 1643 7     Pain Loc --      Pain Edu? --      Excl. in GC? --    No data found.  Updated Vital Signs BP (!) 144/90 (BP Location: Right Arm)   Pulse 80   Temp 98.3 F (36.8 C) (Oral)   Wt 180 lb (81.6 kg)   SpO2 98%   BMI 30.90 kg/m   Visual Acuity Right Eye Distance:   Left Eye Distance:   Bilateral Distance:    Right Eye Near:   Left Eye Near:    Bilateral Near:     Physical Exam  Constitutional: She is oriented to person, place, and time. She appears well-developed and well-nourished.  Pt lying  on Left side on exam room. NAD.   HENT:  Head: Normocephalic and atraumatic.  Eyes: EOM are normal.  Neck: Normal range of motion.  Cardiovascular: Normal rate.  Pulmonary/Chest: Effort normal. No respiratory distress. She exhibits tenderness.    Musculoskeletal: Normal range of motion.       Back:  Pt able to transition from lying on Left side to sitting on exam bed w/o difficulty. Normal gait  Neurological: She is alert and oriented to person, place, and time.  Skin: Skin is warm and dry.  Psychiatric: She has a normal mood and affect. Her behavior is normal.  Nursing note and vitals reviewed.    UC Treatments / Results  Labs (all labs ordered are listed, but only abnormal results are displayed) Labs Reviewed - No data to display  EKG None  Radiology Dg Ribs Unilateral W/chest Left  Result Date: 09/09/2018 CLINICAL DATA:  Fall yesterday with left rib pain, initial encounter EXAM: LEFT RIBS AND CHEST - 3+ VIEW COMPARISON:  03/07/2016 FINDINGS: Cardiac shadow is within normal limits. The lungs are well aerated bilaterally. No focal infiltrate or sizable effusion is seen. No rib fracture is identified. No pneumothorax is seen. IMPRESSION: No acute rib fracture noted. Electronically Signed   By: Alcide Clever M.D.   On: 09/09/2018 17:34   Dg Hip Unilat W Or Wo Pelvis 2-3 Views Left  Result Date: 09/09/2018 CLINICAL DATA:  Recent fall with left hip pain, initial encounter EXAM: DG HIP (WITH OR WITHOUT PELVIS) 3V LEFT COMPARISON:  None. FINDINGS: Pelvic ring is intact. No acute fracture or dislocation is noted. No soft tissue changes are seen. No other focal abnormality is noted. IMPRESSION: No acute abnormality seen. Electronically Signed   By: Alcide Clever M.D.   On: 09/09/2018 17:34    Procedures Procedures (including critical care time)  Medications Ordered in UC Medications - No data to display  Initial Impression / Assessment and Plan / UC Course  I have reviewed the  triage vital signs and the nursing notes.  Pertinent labs & imaging  results that were available during my care of the patient were reviewed by me and considered in my medical decision making (see chart for details).    Discussed normal imaging with pt.  Encouraged conservative treatment. F/ u with PCP or Sports Medicine in 1 week if needed.  Final Clinical Impressions(s) / UC Diagnoses   Final diagnoses:  Fall from slip, trip, or stumble, initial encounter  Pain of left side of body  Rib pain on left side  Left hip pain     Discharge Instructions      Flexeril (cyclobenzaprine) is a muscle relaxer and may cause drowsiness. Do not drink alcohol, drive, or operate heavy machinery while taking.  You may take 500mg  acetaminophen every 4-6 hours or in combination with ibuprofen 400-600mg  every 6-8 hours as needed for pain and inflammation.  Please call to schedule a follow up appointment with family medicine or sports medicine if not improving in 1 week.     ED Prescriptions    Medication Sig Dispense Auth. Provider   cyclobenzaprine (FLEXERIL) 5 MG tablet Take 1-2 tablets (5-10 mg total) by mouth 2 (two) times daily as needed for muscle spasms. 30 tablet Waylan Rocher O, PA-C   ibuprofen (ADVIL,MOTRIN) 600 MG tablet Take 1 tablet (600 mg total) by mouth every 6 (six) hours as needed. 30 tablet Lurene Shadow, PA-C     Controlled Substance Prescriptions [place] Controlled Substance Registry consulted? Not Applicable   Rolla Plate 09/09/18 1820

## 2018-09-09 NOTE — ED Triage Notes (Signed)
Pt states she tripped on a rug about 2am. Fell and landed on left side of body. C/o shoulder and rib pain. Motrin at 12pm.

## 2018-10-31 ENCOUNTER — Encounter (HOSPITAL_BASED_OUTPATIENT_CLINIC_OR_DEPARTMENT_OTHER): Payer: Self-pay

## 2018-10-31 ENCOUNTER — Other Ambulatory Visit: Payer: Self-pay

## 2018-10-31 ENCOUNTER — Emergency Department (HOSPITAL_BASED_OUTPATIENT_CLINIC_OR_DEPARTMENT_OTHER)
Admission: EM | Admit: 2018-10-31 | Discharge: 2018-11-01 | Disposition: A | Discharge status: Home / Self Care | Payer: BLUE CROSS/BLUE SHIELD | Attending: Emergency Medicine | Admitting: Emergency Medicine

## 2018-10-31 DIAGNOSIS — R05 Cough: Secondary | ICD-10-CM | POA: Diagnosis present

## 2018-10-31 DIAGNOSIS — J111 Influenza due to unidentified influenza virus with other respiratory manifestations: Secondary | ICD-10-CM

## 2018-10-31 DIAGNOSIS — R69 Illness, unspecified: Secondary | ICD-10-CM

## 2018-10-31 MED ORDER — IBUPROFEN 800 MG PO TABS
800.0000 mg | ORAL_TABLET | Freq: Once | ORAL | Status: AC
  Administered 2018-10-31: 800 mg via ORAL
  Filled 2018-10-31: qty 1

## 2018-10-31 NOTE — ED Provider Notes (Signed)
[facility] Provider Note: [name] [name], MD, FACEP  CSN: [id number] MRN: [id number] ARRIVAL: 10/31/18 at 2148 ROOM: [facility]04/[facility]04   CHIEF COMPLAINT  Cough   HISTORY OF PRESENT ILLNESS  10/31/18 11:56 PM Elizabeth Vaughn is a 62 y.o. female with a 3-day history of flulike symptoms.  Specifically she has had nasal congestion, rhinorrhea with blood-streaked sputum, sore throat, earache, cough, body aches and fever to 102.  She has been taking Tylenol without adequate relief.  She is having some mild shortness of breath.  She was noted to be febrile on arrival and was given 800 mg of ibuprofen.   Past Medical History:  Diagnosis Date  . Rosacea     Past Surgical History:  Procedure Laterality Date  . ECTOPIC PREGNANCY SURGERY    . tubal preg.      Family History  Problem Relation Age of Onset  . Diabetes Paternal Grandfather   . Diabetes Maternal Grandfather   . Hypertension Father   . Cancer Mother   . Breast cancer Mother [id number]  . Diabetes Son   . Diabetes Daughter     Social History   Tobacco Use  . Smoking status: Never Smoker  . Smokeless tobacco: Never Used  Substance Use Topics  . Alcohol use: No    Alcohol/week: 0.0 standard drinks  . Drug use: No    Prior to Admission medications   Medication Sig Start Date End Date Taking? Authorizing Provider  cyclobenzaprine (FLEXERIL) 5 MG tablet Take 1-2 tablets (5-10 mg total) by mouth 2 (two) times daily as needed for muscle spasms. 09/09/18   Lurene ShadowPhelps, Erin O, PA-C  HYDROcodone-acetaminophen (NORCO/VICODIN) 5-325 MG tablet Take 1-2 tablets by mouth every 4 (four) hours as needed for severe pain. Take with food. 06/11/18   Lajean ManesMassey, David, MD  ibuprofen (ADVIL,MOTRIN) 600 MG tablet Take 1 tablet (600 mg total) by mouth every 6 (six) hours as needed. 09/09/18   Lurene ShadowPhelps, Erin O, PA-C  meloxicam (MOBIC) 7.5 MG tablet Take 1 twice a day as needed for pain. Take with food. (Do not take with any other NSAID.) 06/11/18    Lajean ManesMassey, David, MD  predniSONE (DELTASONE) 50 MG tablet Take 1 daily with a meal for 5 days 06/11/18   Lajean ManesMassey, David, MD    Allergies Macrodantin [nitrofurantoin]   REVIEW OF SYSTEMS  Negative except as noted here or in the History of Present Illness.   PHYSICAL EXAMINATION  Initial Vital Signs Blood pressure 114/66, pulse (!) 103, temperature (!) 101.4 F (38.6 C), temperature source Oral, resp. rate 20, weight 82 kg, SpO2 100 %.  Examination General: Well-developed, well-nourished female in no acute distress; appearance consistent with age of record HENT: normocephalic; atraumatic; nasal congestion; TMs normal; small erythematous spot on soft palate, pharynx otherwise normal Eyes: pupils equal, round and reactive to light; extraocular muscles intact Neck: supple Heart: regular rate and rhythm Lungs: Decreased air movement bilaterally without frank wheezing Abdomen: soft; nondistended; nontender; bowel sounds present Extremities: No deformity; full range of motion; pulses normal Neurologic: Awake, alert and oriented; motor function intact in all extremities and symmetric; no facial droop Skin: Warm and dry Psychiatric: Flat affect   RESULTS  Summary of this visit's results, reviewed by myself:   EKG Interpretation  Date/Time:    Ventricular Rate:    PR Interval:    QRS Duration:   QT Interval:    QTC Calculation:   R Axis:     Text Interpretation:  Laboratory Studies: No results found for this or any previous visit (from the past 24 hour(s)). Imaging Studies: No results found.  ED COURSE and MDM  Nursing notes and initial vitals signs, including pulse oximetry, reviewed.  Vitals:   10/31/18 2157 10/31/18 2258  BP: 97/77 114/66  Pulse: (!) 119 (!) 103  Resp: (!) 22 20  Temp: (!) 100.4 F (38 C) (!) 101.4 F (38.6 C)  TempSrc: Oral Oral  SpO2: 92% 100%  Weight: 82 kg    Patient likely has influenza or influenza-like illness.  We will provide her  with an inhaler and instruct her in its use.  We will prescribe naproxen and Tussionex for symptom relief.  PROCEDURES    ED DIAGNOSES     ICD-10-CM   1. Influenza-like illness R69        Paula LibraMolpus, Skylar Priest, MD 11/01/18 [phone]

## 2018-10-31 NOTE — ED Triage Notes (Signed)
C/o flu like sx x 3 days-NAD-steady gait 

## 2018-10-31 NOTE — ED Notes (Signed)
PT denies emesis. Drinking po's today. C/o headache/ body aches. Sinus congestion.

## 2018-11-01 MED ORDER — NAPROXEN 375 MG PO TABS: ORAL_TABLET | ORAL | 0 refills | Status: DC

## 2018-11-01 MED ORDER — ALBUTEROL SULFATE HFA 108 (90 BASE) MCG/ACT IN AERS
2.0000 | INHALATION_SPRAY | RESPIRATORY_TRACT | Status: DC | PRN
  Administered 2018-11-01: 2 via RESPIRATORY_TRACT
  Filled 2018-11-01: qty 6.7

## 2018-11-01 MED ORDER — HYDROCOD POLST-CPM POLST ER 10-8 MG/5ML PO SUER
5.0000 mL | Freq: Once | ORAL | Status: AC
  Administered 2018-11-01: 5 mL via ORAL
  Filled 2018-11-01: qty 5

## 2018-11-01 MED ORDER — HYDROCOD POLST-CPM POLST ER 10-8 MG/5ML PO SUER: 5.0000 mL | Freq: Two times a day (BID) | ORAL | 0 refills | Status: DC | PRN

## 2019-12-13 ENCOUNTER — Emergency Department (HOSPITAL_BASED_OUTPATIENT_CLINIC_OR_DEPARTMENT_OTHER)
Admission: EM | Admit: 2019-12-13 | Discharge: 2019-12-13 | Disposition: A | Discharge status: Home / Self Care | Payer: BLUE CROSS/BLUE SHIELD | Attending: Emergency Medicine | Admitting: Emergency Medicine

## 2019-12-13 ENCOUNTER — Other Ambulatory Visit: Payer: Self-pay

## 2019-12-13 ENCOUNTER — Encounter (HOSPITAL_BASED_OUTPATIENT_CLINIC_OR_DEPARTMENT_OTHER): Payer: Self-pay | Admitting: *Deleted

## 2019-12-13 DIAGNOSIS — R519 Headache, unspecified: Secondary | ICD-10-CM | POA: Diagnosis present

## 2019-12-13 DIAGNOSIS — Z5321 Procedure and treatment not carried out due to patient leaving prior to being seen by health care provider: Secondary | ICD-10-CM | POA: Insufficient documentation

## 2019-12-13 NOTE — ED Notes (Signed)
Pt informed registration she is leaving due to long wait times.

## 2019-12-13 NOTE — ED Triage Notes (Signed)
Sharp pain in her head x 2 weeks. Tightness in her left arm since last night.

## 2020-04-14 ENCOUNTER — Encounter (HOSPITAL_BASED_OUTPATIENT_CLINIC_OR_DEPARTMENT_OTHER): Payer: Self-pay

## 2020-04-14 ENCOUNTER — Other Ambulatory Visit: Payer: Self-pay

## 2020-04-14 ENCOUNTER — Emergency Department (HOSPITAL_BASED_OUTPATIENT_CLINIC_OR_DEPARTMENT_OTHER)
Admission: EM | Admit: 2020-04-14 | Discharge: 2020-04-14 | Disposition: A | Discharge status: Home / Self Care | Payer: BLUE CROSS/BLUE SHIELD | Attending: Emergency Medicine | Admitting: Emergency Medicine

## 2020-04-14 DIAGNOSIS — S199XXA Unspecified injury of neck, initial encounter: Secondary | ICD-10-CM | POA: Diagnosis present

## 2020-04-14 DIAGNOSIS — X501XXA Overexertion from prolonged static or awkward postures, initial encounter: Secondary | ICD-10-CM | POA: Insufficient documentation

## 2020-04-14 DIAGNOSIS — Y9389 Activity, other specified: Secondary | ICD-10-CM | POA: Diagnosis not present

## 2020-04-14 DIAGNOSIS — Y998 Other external cause status: Secondary | ICD-10-CM | POA: Diagnosis not present

## 2020-04-14 DIAGNOSIS — S161XXA Strain of muscle, fascia and tendon at neck level, initial encounter: Secondary | ICD-10-CM | POA: Diagnosis not present

## 2020-04-14 DIAGNOSIS — Y92810 Car as the place of occurrence of the external cause: Secondary | ICD-10-CM | POA: Diagnosis not present

## 2020-04-14 MED ORDER — METHOCARBAMOL 500 MG PO TABS: 500.0000 mg | ORAL_TABLET | Freq: Two times a day (BID) | ORAL | 0 refills | Status: DC | PRN

## 2020-04-14 MED ORDER — KETOROLAC TROMETHAMINE 30 MG/ML IJ SOLN
15.0000 mg | Freq: Once | INTRAMUSCULAR | Status: AC
Start: 2020-04-14 — End: 2020-04-14
  Administered 2020-04-14: 15 mg via INTRAMUSCULAR
  Filled 2020-04-14: qty 1

## 2020-04-14 MED ORDER — NAPROXEN 250 MG PO TABS: 500.0000 mg | ORAL_TABLET | Freq: Two times a day (BID) | ORAL | 0 refills | Status: DC

## 2020-04-14 NOTE — ED Provider Notes (Signed)
[facility] EMERGENCY DEPARTMENT Provider Note   CSN: [id number] Arrival date & time: 04/14/20  1330     History Chief Complaint  Patient presents with   Neck Pain    Elizabeth Vaughn is a 64 y.o. female presenting for evaluation of neck pain and right shoulder pain.  Patient states 4 days ago she was driving when she looked over her shoulder, and had an onset right sided neck and shoulder pain.  Since then, pain has gradually worsened.  Pain is worse with movement and palpation.  She took 1 dose of Tylenol yesterday without improvement of symptoms.  She has been trying heat/ice and stretching without improvement of symptoms.  She went to a chiropractor today, but continues to have pain.  She denies numbness or tingling.  Patient reports pain is shooting down her arm, causing difficulty raising her arm.  She denies pain, numbness, or tingling in the distal right arm.  She denies fall, trauma, or injury.  She denies fevers or chills.  She states she has no other medical problems, takes no medications daily.  No previous history of neck problems/torticollis.  HPI     Past Medical History:  Diagnosis Date   Rosacea     Patient Active Problem List   Diagnosis Date Noted   Bilateral knee pain 04/21/2016    Past Surgical History:  Procedure Laterality Date   ECTOPIC PREGNANCY SURGERY     tubal preg.       OB History    Gravida  6   Para  4   Term  4   Preterm      AB  2   Living        SAB  1   TAB      Ectopic  1   Multiple      Live Births  4           Family History  Problem Relation Age of Onset   Diabetes Paternal Grandfather    Diabetes Maternal Grandfather    Hypertension Father    Cancer Mother    Breast cancer Mother 84   Diabetes Son    Diabetes Daughter     Social History   Tobacco Use   Smoking status: Never Smoker   Smokeless tobacco: Never Used  Substance Use Topics   Alcohol use: No    Alcohol/week:  0.0 standard drinks   Drug use: No    Home Medications Prior to Admission medications   Medication Sig Start Date End Date Taking? Authorizing Provider  methocarbamol (ROBAXIN) 500 MG tablet Take 1 tablet (500 mg total) by mouth 2 (two) times daily as needed for muscle spasms. 04/14/20   Lavonta Tillis, PA-C  naproxen (NAPROSYN) 250 MG tablet Take 2 tablets (500 mg total) by mouth 2 (two) times daily with a meal. 04/14/20   Ziair Penson, PA-C    Allergies    Macrodantin [nitrofurantoin]  Review of Systems   Review of Systems  Musculoskeletal: Positive for arthralgias, myalgias and neck pain.  All other systems reviewed and are negative.   Physical Exam Updated Vital Signs BP 136/64 (BP Location: Right Arm)    Pulse 72    Temp 97.9 F (36.6 C) (Oral)    Resp 16    Ht 5\' 4"  (1.626 m)    Wt 85.5 kg    SpO2 97%    BMI 32.34 kg/m   Physical Exam Vitals and nursing note reviewed.  Constitutional:  General: She is not in acute distress.    Appearance: She is well-developed.     Comments: Resting in the bed in no acute distress  HENT:     Head: Normocephalic and atraumatic.  Eyes:     Conjunctiva/sclera: Conjunctivae normal.     Pupils: Pupils are equal, round, and reactive to light.  Neck:     Comments: Tenderness palpation of right sided neck musculature including over the SCM.  Tenderness palpation of right paracervical muscles. Cardiovascular:     Rate and Rhythm: Normal rate and regular rhythm.     Pulses: Normal pulses.  Pulmonary:     Effort: Pulmonary effort is normal. No respiratory distress.     Breath sounds: Normal breath sounds. No wheezing.  Abdominal:     General: There is no distension.     Palpations: Abdomen is soft.     Tenderness: There is no abdominal tenderness.  Musculoskeletal:        General: Tenderness present. Normal range of motion.     Cervical back: Normal range of motion and neck supple.     Comments: Tenderness palpation of right  trapezius muscle.  No obvious deformity.  No erythema, warmth, or swelling.  Pain with active range of motion of the right shoulder.  No pain with movement of the elbow or wrist.  Radial pulses 2+ bilaterally.  Grip strength equal bilaterally.  Skin:    General: Skin is warm and dry.     Capillary Refill: Capillary refill takes less than 2 seconds.  Neurological:     Mental Status: She is alert and oriented to person, place, and time.     ED Results / Procedures / Treatments   Labs (all labs ordered are listed, but only abnormal results are displayed) Labs Reviewed - No data to display  EKG None  Radiology No results found.  Procedures Procedures (including critical care time)  Medications Ordered in ED Medications  ketorolac (TORADOL) 30 MG/ML injection 15 mg (15 mg Intramuscular Given 04/14/20 1428)    ED Course  I have reviewed the triage vital signs and the nursing notes.  Pertinent labs & imaging results that were available during my care of the patient were reviewed by me and considered in my medical decision making (see chart for details).    MDM Rules/Calculators/A&P                      Patient presenting for evaluation of right-sided neck and shoulder pain.  On exam, patient appears nontoxic.  She is neurovascularly intact.  She does have pain with movement of her shoulder, limiting her motion.  However distal extremities have equal grip strength and sensation.  As such, low suspicion for stroke.  Likely MSK pain.  Consider pinched nerve/cervical radiculopathy, however with symmetrical grip strength, I do not believe she needs emergent MRI today.  Will treat symptomatically with NSAIDs and muscle relaxers.  Will have patient continue to follow-up outpatient.  At this time, patient appears safe for discharge.  Return precautions given.  Patient states she understands and agrees to plan.  Final Clinical Impression(s) / ED Diagnoses Final diagnoses:  Strain of neck  muscle, initial encounter    Rx / DC Orders ED Discharge Orders         Ordered    methocarbamol (ROBAXIN) 500 MG tablet  2 times daily PRN     04/14/20 1421    naproxen (NAPROSYN) 250 MG tablet  2 times  daily with meals     04/14/20 1421           Britaney Espaillat, PA-C 04/14/20 [place], [place], DO 04/15/20 [phone]

## 2020-04-14 NOTE — ED Triage Notes (Signed)
Pt c/o posterior neck  pain 6/3-pain to right UE started yesterday-denies injury-states she saw chiropractor today with plan to return tomorrow-states "didn't give me anything for the pain"-NAD-steady gait

## 2020-04-14 NOTE — Discharge Instructions (Signed)
Take naproxen 2 times a day with meals.  Do not take other anti-inflammatories at the same time (Advil, Motrin, ibuprofen, Aleve). You may supplement with Tylenol if you need further pain control. Use Robaxin as needed for muscle stiffness or soreness. Have caution, as this may make you tired or groggy. Do not drive or operate heavy machinery while taking this medication.  Use muscle creams (bengay, icy hot, salonpas) as needed for pain.  Use heat/ice to help with pain. Follow up with your primary care doctor if pain is not improving with this treatment.  Return to the ER if you develop high fevers, numbness, difficulty breathing, or any new or concerning symptoms.

## 2020-11-10 ENCOUNTER — Other Ambulatory Visit: Payer: Self-pay

## 2020-11-10 ENCOUNTER — Ambulatory Visit
Admission: EM | Admit: 2020-11-10 | Discharge: 2020-11-10 | Disposition: A | Discharge status: Home / Self Care | Payer: BLUE CROSS/BLUE SHIELD | Attending: Emergency Medicine | Admitting: Emergency Medicine

## 2020-11-10 DIAGNOSIS — J069 Acute upper respiratory infection, unspecified: Secondary | ICD-10-CM | POA: Diagnosis not present

## 2020-11-10 MED ORDER — BENZONATATE 200 MG PO CAPS: 200.0000 mg | ORAL_CAPSULE | Freq: Three times a day (TID) | ORAL | 0 refills | Status: AC | PRN

## 2020-11-10 MED ORDER — FLUTICASONE PROPIONATE 50 MCG/ACT NA SUSP: 1.0000 | Freq: Every day | NASAL | 0 refills | Status: AC

## 2020-11-10 NOTE — Discharge Instructions (Addendum)
Covid test pending Rest and fluids Ibuprofen and Tylenol for fevers body aches chills Tessalon for cough Flonase for congestion Follow-up if not improving or worsening 

## 2020-11-10 NOTE — ED Triage Notes (Signed)
Pt c/o headaches, cough, fever, bilateral ear pain, and fatigue since Friday.

## 2020-11-10 NOTE — ED Provider Notes (Signed)
[facility]    CSN: [id number] Arrival date & time: 11/10/20  1825      History   Chief Complaint Chief Complaint  Patient presents with  . Cough    HPI Elizabeth Vaughn is a 65 y.o. female presenting today for evaluation of URI symptoms.  Reports cough, congestion sore throat headache and ear pain.  Has had associated fatigue and low energy.  Reports Covid exposure over Christmas.  Husband here with similar symptoms.  Denies prior Covid infection.  HPI  Past Medical History:  Diagnosis Date  . Rosacea     Patient Active Problem List   Diagnosis Date Noted  . Bilateral knee pain 04/21/2016    Past Surgical History:  Procedure Laterality Date  . ECTOPIC PREGNANCY SURGERY    . tubal preg.      OB History    Gravida  6   Para  4   Term  4   Preterm      AB  2   Living        SAB  1   IAB      Ectopic  1   Multiple      Live Births  4            Home Medications    Prior to Admission medications   Medication Sig Start Date End Date Taking? Authorizing Provider  benzonatate (TESSALON) 200 MG capsule Take 1 capsule (200 mg total) by mouth 3 (three) times daily as needed for up to 7 days for cough. 11/10/20 11/17/20 Yes Ruford Dudzinski C, PA-C  fluticasone (FLONASE) 50 MCG/ACT nasal spray Place 1-2 sprays into both nostrils daily. 11/10/20  Yes Annsley Akkerman, Junius Creamer, PA-C    Family History Family History  Problem Relation Age of Onset  . Diabetes Paternal Grandfather   . Diabetes Maternal Grandfather   . Hypertension Father   . Cancer Mother   . Breast cancer Mother 42  . Diabetes Son   . Diabetes Daughter     Social History Social History   Tobacco Use  . Smoking status: Never Smoker  . Smokeless tobacco: Never Used  Vaping Use  . Vaping Use: Never used  Substance Use Topics  . Alcohol use: No    Alcohol/week: 0.0 standard drinks  . Drug use: No     Allergies   Macrodantin [nitrofurantoin]   Review of Systems Review  of Systems  Constitutional: Positive for chills and fatigue. Negative for activity change, appetite change and fever.  HENT: Positive for congestion, rhinorrhea and sore throat. Negative for ear pain, sinus pressure and trouble swallowing.   Eyes: Negative for discharge and redness.  Respiratory: Positive for cough. Negative for chest tightness and shortness of breath.   Cardiovascular: Negative for chest pain.  Gastrointestinal: Negative for abdominal pain, diarrhea, nausea and vomiting.  Musculoskeletal: Negative for myalgias.  Skin: Negative for rash.  Neurological: Positive for headaches. Negative for dizziness and light-headedness.     Physical Exam Triage Vital Signs ED Triage Vitals  Enc Vitals Group     BP      Pulse      Resp      Temp      Temp src      SpO2      Weight      Height      Head Circumference      Peak Flow      Pain Score      Pain Loc  Pain Edu?      Excl. in GC?    No data found.  Updated Vital Signs BP 121/81 (BP Location: Left Arm)   Pulse (!) 102   Temp 99.6 F (37.6 C) (Oral)   Resp 18   Visual Acuity Right Eye Distance:   Left Eye Distance:   Bilateral Distance:    Right Eye Near:   Left Eye Near:    Bilateral Near:     Physical Exam Vitals and nursing note reviewed.  Constitutional:      Appearance: She is well-developed and well-nourished.     Comments: No acute distress  HENT:     Head: Normocephalic and atraumatic.     Ears:     Comments: Bilateral ears without tenderness to palpation of external auricle, tragus and mastoid, EAC's without erythema or swelling, TM's with good bony landmarks and cone of light. Non erythematous.     Nose: Nose normal.     Mouth/Throat:     Comments: Oral mucosa pink and moist, no tonsillar enlargement or exudate. Posterior pharynx patent and nonerythematous, no uvula deviation or swelling. Normal phonation. Eyes:     Conjunctiva/sclera: Conjunctivae normal.  Cardiovascular:     Rate  and Rhythm: Normal rate and regular rhythm.  Pulmonary:     Effort: Pulmonary effort is normal. No respiratory distress.     Comments: Breathing comfortably at rest, CTABL, no wheezing, rales or other adventitious sounds auscultated Abdominal:     General: There is no distension.  Musculoskeletal:        General: Normal range of motion.     Cervical back: Neck supple.  Skin:    General: Skin is warm and dry.  Neurological:     Mental Status: She is alert and oriented to person, place, and time.  Psychiatric:        Mood and Affect: Mood and affect normal.      UC Treatments / Results  Labs (all labs ordered are listed, but only abnormal results are displayed) Labs Reviewed  NOVEL CORONAVIRUS, NAA    EKG   Radiology No results found.  Procedures Procedures (including critical care time)  Medications Ordered in UC Medications - No data to display  Initial Impression / Assessment and Plan / UC Course  I have reviewed the triage vital signs and the nursing notes.  Pertinent labs & imaging results that were available during my care of the patient were reviewed by me and considered in my medical decision making (see chart for details).     Viral URI with cough-Covid test pending.  Recommending symptomatic and supportive care rest and fluids.  Exam reassuring today, lungs clear to auscultation.  Continue to monitor.  Discussed strict return precautions. Patient verbalized understanding and is agreeable with plan.  Final Clinical Impressions(s) / UC Diagnoses   Final diagnoses:  Viral URI with cough     Discharge Instructions     Covid test pending Rest and fluids Ibuprofen and Tylenol for fevers body aches chills Tessalon for cough Flonase for congestion Follow-up if not improving or worsening    ED Prescriptions    Medication Sig Dispense Auth. Provider   benzonatate (TESSALON) 200 MG capsule Take 1 capsule (200 mg total) by mouth 3 (three) times daily  as needed for up to 7 days for cough. 28 capsule Alyse Kathan C, PA-C   fluticasone (FLONASE) 50 MCG/ACT nasal spray Place 1-2 sprays into both nostrils daily. 16 g Nicolena Schurman, Ceiba C, [place]  PDMP not reviewed this encounter.   Tien Aispuro, Farber C, PA-C 11/12/20 1031

## 2020-11-12 LAB — SARS-COV-2, NAA 2 DAY TAT

## 2020-11-12 LAB — NOVEL CORONAVIRUS, NAA: SARS-CoV-2, NAA: DETECTED — AB

## 2022-11-26 ENCOUNTER — Emergency Department (HOSPITAL_BASED_OUTPATIENT_CLINIC_OR_DEPARTMENT_OTHER): Payer: Medicare Other

## 2022-11-26 ENCOUNTER — Emergency Department (HOSPITAL_BASED_OUTPATIENT_CLINIC_OR_DEPARTMENT_OTHER)
Admission: EM | Admit: 2022-11-26 | Discharge: 2022-11-26 | Disposition: A | Discharge status: Home / Self Care | Payer: Medicare Other | Attending: Emergency Medicine | Admitting: Emergency Medicine

## 2022-11-26 ENCOUNTER — Encounter (HOSPITAL_BASED_OUTPATIENT_CLINIC_OR_DEPARTMENT_OTHER): Payer: Self-pay | Admitting: Emergency Medicine

## 2022-11-26 ENCOUNTER — Other Ambulatory Visit: Payer: Self-pay

## 2022-11-26 DIAGNOSIS — J101 Influenza due to other identified influenza virus with other respiratory manifestations: Secondary | ICD-10-CM | POA: Diagnosis not present

## 2022-11-26 DIAGNOSIS — R059 Cough, unspecified: Secondary | ICD-10-CM | POA: Diagnosis present

## 2022-11-26 DIAGNOSIS — Z1152 Encounter for screening for COVID-19: Secondary | ICD-10-CM | POA: Diagnosis not present

## 2022-11-26 LAB — RESP PANEL BY RT-PCR (RSV, FLU A&B, COVID)  RVPGX2
Influenza A by PCR: POSITIVE — AB
Influenza B by PCR: NEGATIVE
Resp Syncytial Virus by PCR: NEGATIVE
SARS Coronavirus 2 by RT PCR: NEGATIVE

## 2022-11-26 MED ORDER — PREDNISONE 50 MG PO TABS: 50.0000 mg | ORAL_TABLET | Freq: Every day | ORAL | 0 refills | Status: AC

## 2022-11-26 MED ORDER — PREDNISONE 50 MG PO TABS
60.0000 mg | ORAL_TABLET | Freq: Once | ORAL | Status: AC
  Administered 2022-11-26: 60 mg via ORAL
  Filled 2022-11-26: qty 1

## 2022-11-26 MED ORDER — ONDANSETRON 4 MG PO TBDP: 4.0000 mg | ORAL_TABLET | Freq: Three times a day (TID) | ORAL | 0 refills | Status: DC | PRN

## 2022-11-26 MED ORDER — ALBUTEROL SULFATE HFA 108 (90 BASE) MCG/ACT IN AERS: 2.0000 | INHALATION_SPRAY | RESPIRATORY_TRACT | 0 refills | Status: DC | PRN

## 2022-11-26 MED ORDER — PREDNISONE 50 MG PO TABS: 50.0000 mg | ORAL_TABLET | Freq: Every day | ORAL | 0 refills | Status: DC

## 2022-11-26 MED ORDER — IPRATROPIUM-ALBUTEROL 0.5-2.5 (3) MG/3ML IN SOLN
3.0000 mL | Freq: Once | RESPIRATORY_TRACT | Status: AC
  Administered 2022-11-26: 3 mL via RESPIRATORY_TRACT
  Filled 2022-11-26: qty 3

## 2022-11-26 MED ORDER — ONDANSETRON 4 MG PO TBDP: 4.0000 mg | ORAL_TABLET | Freq: Three times a day (TID) | ORAL | 0 refills | Status: AC | PRN

## 2022-11-26 NOTE — ED Triage Notes (Signed)
Pt state feeling bad since Tuesday. Weakness, fever, sweats.

## 2022-11-26 NOTE — Discharge Instructions (Signed)
Drink plenty of fluids.  Take ibuprofen or naproxen as needed for fever or aching.  To get additional relief, add acetaminophen.  When you combine acetaminophen with either ibuprofen or naproxen, you get better control of pain and fever then if you take either medication by itself.  You may take loperamide (Imodium A-D) as needed for diarrhea.  Return if symptoms are getting worse.

## 2022-11-26 NOTE — ED Provider Notes (Signed)
[facility] Provider Note   CSN: [id number] Arrival date & time: 11/26/22  0353     History  Chief Complaint  Patient presents with   URI    Elizabeth Vaughn is a 67 y.o. female.  The history is provided by the patient.  URI She had onset 4 days ago nonproductive cough, wheezing, shortness of breath, fever to 102, chills, generalized bodyaches.  She denies any sick contacts.  She has also had some nausea and diarrhea.  She has been taking over-the-counter acetaminophen without relief.  She has not received the influenza vaccine this year.   Home Medications Prior to Admission medications   Medication Sig Start Date End Date Taking? Authorizing Provider  fluticasone (FLONASE) 50 MCG/ACT nasal spray Place 1-2 sprays into both nostrils daily. 11/10/20   Wieters, Hallie C, PA-C      Allergies    Macrodantin [nitrofurantoin]    Review of Systems   Review of Systems  All other systems reviewed and are negative.   Physical Exam Updated Vital Signs BP 111/65   Pulse (!) 59   Temp 98.5 F (36.9 C) (Oral)   Resp 20   Ht 5' (1.524 m)   Wt 85.7 kg   SpO2 96%   BMI 36.91 kg/m  Physical Exam Vitals and nursing note reviewed.   67 year old female, resting comfortably and in no acute distress. Vital signs are normal. Oxygen saturation is 96%, which is normal. Head is normocephalic and atraumatic. PERRLA, EOMI. Oropharynx is clear. Neck is nontender and supple without adenopathy or JVD. Back is nontender and there is no CVA tenderness. Lungs have coarse expiratory wheezes without rales or rhonchi. Chest is nontender. Heart has regular rate and rhythm without murmur. Abdomen is soft, flat, nontender. Extremities have no cyanosis or edema, full range of motion is present. Skin is warm and dry without rash. Neurologic: Mental status is normal, cranial nerves are intact, moves all extremities equally.  ED Results / Procedures / Treatments    Labs (all labs ordered are listed, but only abnormal results are displayed) Labs Reviewed  RESP PANEL BY RT-PCR (RSV, FLU A&B, COVID)  RVPGX2 - Abnormal; Notable for the following components:      Result Value   Influenza A by PCR POSITIVE (*)    All other components within normal limits   Radiology DG Chest 2 View  Result Date: 11/26/2022 CLINICAL DATA:  Cough and fever. EXAM: CHEST - 2 VIEW COMPARISON:  None Available. FINDINGS: The heart size and mediastinal contours are within normal limits. Central airway thickening. No pleural fluid or airspace disease. No edema. The visualized skeletal structures are unremarkable. IMPRESSION: Central airway thickening compatible with bronchitis. Electronically Signed   By: Kerby Moors M.D.   On: 11/26/2022 05:22    Procedures Procedures    Medications Ordered in ED Medications  ipratropium-albuterol (DUONEB) 0.5-2.5 (3) MG/3ML nebulizer solution 3 mL (has no administration in time range)    ED Course/ Medical Decision Making/ A&P                             Medical Decision Making Amount and/or Complexity of Data Reviewed Radiology: ordered.  Risk Prescription drug management.   Respiratory tract infection which is likely viral.  I have ordered a chest x-ray to rule out pneumonia.  I have ordered respiratory pathogen panel to look for COVID 19, influenza, RSV.  I have ordered nebulizer  treatment with albuterol and ipratropium to help with her wheezing and shortness of breath.  Chest x-ray shows no evidence of pneumonia.  I have independently viewed the images, and agree with the radiologist's interpretation.  I have reviewed and interpreted her laboratory tests, and my interpretation is positive PCR for influenza A, which is responsible for her symptoms.  Following nebulizer treatment, patient states that cough is much improved and she is breathing better.  I have ordered a dose of prednisone.  I have ordered prescriptions for  prednisone, albuterol inhaler, and ondansetron oral dissolving tablet.  I have advised her to use over-the-counter loperamide as needed for diarrhea.  Over-the-counter NSAIDs and acetaminophen as needed for fever and aching.  Return precautions discussed.  Final Clinical Impression(s) / ED Diagnoses Final diagnoses:  Influenza A    Rx / DC Orders ED Discharge Orders          Ordered    predniSONE (DELTASONE) 50 MG tablet  Daily,   Status:  Discontinued        11/26/22 0534    albuterol (VENTOLIN HFA) 108 (90 Base) MCG/ACT inhaler  Every 4 hours PRN,   Status:  Discontinued        11/26/22 0534    ondansetron (ZOFRAN-ODT) 4 MG disintegrating tablet  Every 8 hours PRN,   Status:  Discontinued        11/26/22 0534    albuterol (VENTOLIN HFA) 108 (90 Base) MCG/ACT inhaler  Every 4 hours PRN        11/26/22 0551    ondansetron (ZOFRAN-ODT) 4 MG disintegrating tablet  Every 8 hours PRN        11/26/22 0551    predniSONE (DELTASONE) 50 MG tablet  Daily        11/26/22 9326              Delora Fuel, MD 71/24/58 [phone]

## 2022-12-16 NOTE — Progress Notes (Signed)
NEW PATIENT Date of Service/Encounter:  12/17/22 Referring provider: [place] Vaugh[place]* Primary care provider: [place]  Subjective:  Elizabeth Vaughn is a 67 y.o. female with a PMHx of vitamin D deficiency and sarcoidosis presenting today for evaluation of wheezing.  History obtained from: chart review and patient.   [employer] insurance-consult only.  Patient reports wheezing for the past 2 weeks. Carries a diagnosis of sarcoidosis in remission for 20 years, not on medications or followed by pulmonary for this. Of note, was diagnosed with influenza 2 weeks ago. She was given prednisone and albuterol by ED which she has been using twice a day or every other day and this is helping. She notices when she lays down, she is wheezing a lot since having the flu.  She does feel she has been having wheezing at night around twice per week prior to the flu.  She has had albuterol in the past when first diagnosed with sarcoidosis 20 years ago. Was given prednisone at that time as well which was helpful.  She has not used prednisone outside of this recent ED visit in the past year. Never evaluated for asthma. Her mother, children and grandchildren all have asthma.  She was not given any antibiotics.   She also reports sneezing which occurs year round since she was in her 70s or 103s. Never allergy tested. No prior surgeries.  No prior AIT.  Not on any allergy medications.   Her PCP gave her a nasal spray - Flonase - which sometimes helps.    Since having the flu, she continues to have significant nasal drainage, foul smelling breath, yellow and green mucus.  She can smell and taste.  No sinus pressure, some headaches.   Occasionally when she eats seafood she feels "weird, like itchy." Her son can't eat shrimp and great grandduaghter also has shellfish and peanut allergy. Last ate seafood a few weeks ago. She does continue to eat seafood regularly and unsure if she should, but she loves  it.  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 He could not wait the time needed to undergo testing.   Chart review:  CXR 11/26/22: Central airway thickening compatible with bronchitis  AEC 100 06/27/22 Vitamin D --16 (06/27/22)  Other allergy screening: Food allergy: no Hymenoptera allergy: no Eczema:no, does have rosacea History of recurrent infections suggestive of immunodeficency: no Vaccinations are up to date.   Past Medical History: Past Medical History:  Diagnosis Date   Rosacea    Medication List:  Current Outpatient Medications  Medication Sig Dispense Refill   amoxicillin-clavulanate (AUGMENTIN) 875-125 MG tablet Take 1 tablet by mouth 2 (two) times daily for 10 days. 20 tablet 0   Budeson-Glycopyrrol-Formoterol (BREZTRI AEROSPHERE) 160-9-4.8 MCG/ACT AERO Inhale 2 puffs into the lungs in the morning and at bedtime. 1 each 3   albuterol (VENTOLIN HFA) 108 (90 Base) MCG/ACT inhaler Inhale 2 puffs into the lungs every 4 (four) hours as needed for wheezing or shortness of breath. (Patient not taking: Reported on 12/17/2022) 1 each 0   fluticasone (FLONASE) 50 MCG/ACT nasal spray Place 1-2 sprays into both nostrils daily. (Patient not taking: Reported on 12/17/2022) 16 g 0   ondansetron (ZOFRAN-ODT) 4 MG disintegrating tablet Take 1 tablet (4 mg total) by mouth every 8 (eight) hours as needed. 22m ODT q4 hours prn nausea/vomit (Patient not taking: Reported on 12/17/2022) 12  tablet 0   predniSONE (DELTASONE) 50 MG tablet Take 1 tablet (50 mg total) by mouth daily. (Patient not taking: Reported on 12/17/2022) 5 tablet 0   No current facility-administered medications for this visit.   Known Allergies:  Allergies  Allergen Reactions   Macrodantin [Nitrofurantoin]    Past Surgical History: Past Surgical History:  Procedure Laterality Date   ECTOPIC PREGNANCY SURGERY     tubal preg.      Family History: Family History  Problem Relation Age of Onset   Cancer Mother    Breast cancer Mother 66   Hypertension Father    Diabetes Maternal Grandfather    Diabetes Paternal Grandfather    Diabetes Daughter    Asthma Daughter    Diabetes Son    Asthma Son    Social History: Elizabeth Vaughn lives in a house built 30 years ago, carpet in bedroom, gas heating, central AC, no pets, no smoke exposure.  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  [place]   ROS:  All other systems negative except as noted per HPI.  Objective:  Blood pressure 122/70, pulse 88, temperature 98.7 F (37.1 C), temperature source Temporal, resp. rate 18, height 5' 4"$  (1.626 m), weight 193 lb 1.6 oz (87.6 kg), SpO2 98 %. Body mass index is 33.15 kg/m. Physical Exam:  General Appearance:  Alert, cooperative, no distress, appears stated age  Head:  Normocephalic, without obvious abnormality, atraumatic  Eyes:  Conjunctiva clear, EOM's intact  Nose: Nares normal, hypertrophic turbinates, normal mucosa, and no visible anterior polyps  Throat: Lips, tongue normal; teeth and gums normal, normal posterior oropharynx  Neck: Supple, symmetrical  Lungs:   clear to auscultation bilaterally, Respirations unlabored, no coughing  Heart:  regular rate and rhythm and no murmur, Appears well perfused  Extremities: No edema  Skin: Skin color, texture, turgor normal, no rashes or lesions on visualized portions of skin  Neurologic: No gross deficits   Diagnostics: Spirometry:  Tracings reviewed. Her effort: Poor effort, data can not be interpreted. FVC: 1.23L  FEV1: 1.37L, 69% predicted  FEV1/FVC ratio: 1.11  Interpretation: Spirometry uninterpretable due to technique  Labs see below Assessment and Plan  Wheezing in setting of respiratory illness, hx of sarcoidosis - your lung testing today did not show obstruction, but was  hard to read due to your technique - consider establishing with pulmonary for your sarcoidosis - Controller Inhaler: Start Breztri 160 mcg 2 puffs twice a day; This Should Be Used Everyday - Rinse mouth out after use - Rescue Inhaler: Albuterol (Proair/Ventolin) 2 puffs . Use  every 4-6 hours as needed for chest tightness, wheezing, or coughing.  Can also use 15 minutes prior to exercise if you have symptoms with activity. - Symptoms are not controlled if:  - Symptoms are occurring >2 times a week OR  - >2 times a month nighttime awakenings  - You are requiring systemic steroids (prednisone/steroid injections) more than once per year  - Your require hospitalization for your asthma.  - Please call the clinic to schedule a follow up if these symptoms arise  Chronic Rhinitis - allergic vs nonallergic with suspected acute bacterial sinusitis - allergy testing labs today - augmentin 875 mg (1 tablet) take twice daily for 10 days with probiotics or live cultured yogurt   - Symptom control: - Continue Nasal Steroid Spray: Best results if used daily. - Options include Flonase (  fluticasone), Nasocort (triamcinolone), Nasonex (mometasome) 1- 2 sprays in each nostril daily.  - All can be purchased over-the-counter if not covered by insurance. - Start Astelin (azelastine) 1-2 sprays in each nostril twice a day as needed for nasal congestion/itchy nose - Start Antihistamine: daily or daily as needed.   -Options include Zyrtec (Cetirizine) 41m, Claritin (Loratadine) 171m Allegra (Fexofenadine) 18072mor Xyzal (Levocetirinze) 5mg51mCan be purchased over-the-counter if not covered by insurance.  Food intolerance vs allergy (shellfish/fish):  - labs today shellfish and fish panel - please strictly avoid shellfish and fish until lab results are ready - for SKIN only reaction, okay to take Benadryl 2 capsules every 4 hours - for SKIN + ANY additional symptoms, OR IF concern for LIFE THREATENING reaction  = Epipen Autoinjector EpiPen 0.3 mg. - If using Epinephrine autoinjector, call 911 - Medic Alert identification is recommended.   Follow up : 3 months, sooner if needed. We will call you with lab results It was a pleasure meeting you in clinic today! Thank you for allowing me to participate in your care.  This note in its entirety was forwarded to the Provider who requested this consultation.  Thank you for your kind referral. I appreciate the opportunity to take part in Trinika's care. Please do not hesitate to contact me with questions.  Sincerely,  ErinSigurd [facility]

## 2022-12-17 ENCOUNTER — Ambulatory Visit (INDEPENDENT_AMBULATORY_CARE_PROVIDER_SITE_OTHER): Payer: Medicare Other | Admitting: Internal Medicine

## 2022-12-17 ENCOUNTER — Other Ambulatory Visit: Payer: Self-pay

## 2022-12-17 ENCOUNTER — Encounter: Payer: Self-pay | Admitting: Internal Medicine

## 2022-12-17 VITALS — BP 122/70 | HR 88 | Temp 98.7°F | Resp 18 | Ht 64.0 in | Wt 193.1 lb

## 2022-12-17 DIAGNOSIS — R0602 Shortness of breath: Secondary | ICD-10-CM | POA: Diagnosis not present

## 2022-12-17 DIAGNOSIS — R062 Wheezing: Secondary | ICD-10-CM

## 2022-12-17 DIAGNOSIS — J019 Acute sinusitis, unspecified: Secondary | ICD-10-CM

## 2022-12-17 DIAGNOSIS — J310 Chronic rhinitis: Secondary | ICD-10-CM

## 2022-12-17 DIAGNOSIS — D869 Sarcoidosis, unspecified: Secondary | ICD-10-CM

## 2022-12-17 DIAGNOSIS — Z91013 Allergy to seafood: Secondary | ICD-10-CM

## 2022-12-17 DIAGNOSIS — T781XXA Other adverse food reactions, not elsewhere classified, initial encounter: Secondary | ICD-10-CM

## 2022-12-17 MED ORDER — AMOXICILLIN-POT CLAVULANATE 875-125 MG PO TABS: 1.0000 | ORAL_TABLET | Freq: Two times a day (BID) | ORAL | 0 refills | Status: AC

## 2022-12-17 MED ORDER — BREZTRI AEROSPHERE 160-9-4.8 MCG/ACT IN AERO: 2.0000 | INHALATION_SPRAY | Freq: Two times a day (BID) | RESPIRATORY_TRACT | 3 refills | Status: AC

## 2022-12-17 MED ORDER — ALBUTEROL SULFATE HFA 108 (90 BASE) MCG/ACT IN AERS: 2.0000 | INHALATION_SPRAY | RESPIRATORY_TRACT | 0 refills | Status: DC | PRN

## 2022-12-17 MED ORDER — EPINEPHRINE 0.3 MG/0.3ML IJ SOAJ: 0.3000 mg | INTRAMUSCULAR | 2 refills | Status: AC | PRN

## 2022-12-17 NOTE — Patient Instructions (Signed)
Wheezing in setting of respiratory illness, hx of sarcoidosis - your lung testing today did not show obstruction, but was hard to read due to your technique - consider establishing with pulmonary for your sarcoidosis - Controller Inhaler: Start Breztri 160 mcg 2 puffs twice a day; This Should Be Used Everyday - Rinse mouth out after use - Rescue Inhaler: Albuterol (Proair/Ventolin) 2 puffs . Use  every 4-6 hours as needed for chest tightness, wheezing, or coughing.  Can also use 15 minutes prior to exercise if you have symptoms with activity. - Asthma is not controlled if:  - Symptoms are occurring >2 times a week OR  - >2 times a month nighttime awakenings  - You are requiring systemic steroids (prednisone/steroid injections) more than once per year  - Your require hospitalization for your asthma.  - Please call the clinic to schedule a follow up if these symptoms arise  Chronic Rhinitis - allergic vs nonallergic with suspected acute bacterial sinusitis - allergy testing labs today - augmentin 875 mg (1 tablet) take twice daily for 10 days with probiotics or live cultured yogurt   - Symptom control: - Continue Nasal Steroid Spray: Best results if used daily. - Options include Flonase (fluticasone), Nasocort (triamcinolone), Nasonex (mometasome) 1- 2 sprays in each nostril daily.  - All can be purchased over-the-counter if not covered by insurance. - Start Astelin (azelastine) 1-2 sprays in each nostril twice a day as needed for nasal congestion/itchy nose - Start Antihistamine: daily or daily as needed.   -Options include Zyrtec (Cetirizine) 7m, Claritin (Loratadine) 153m Allegra (Fexofenadine) 18036mor Xyzal (Levocetirinze) 5mg32mCan be purchased over-the-counter if not covered by insurance.  Food intolerance vs allergy (shellfish/fish):  - labs today shellfish and fish panel - please strictly avoid shellfish and fish until lab results are ready - for SKIN only reaction, okay to  take Benadryl 2 capsules every 4 hours - for SKIN + ANY additional symptoms, OR IF concern for LIFE THREATENING reaction = Epipen Autoinjector EpiPen 0.3 mg. - If using Epinephrine autoinjector, call 911 - Medic Alert identification is recommended.   Follow up : 3 months, sooner if needed. We will call you with lab results It was a pleasure meeting you in clinic today! Thank you for allowing me to participate in your care.  ErinSigurd [facility]

## 2022-12-19 LAB — ALLERGENS, ZONE 2

## 2022-12-19 LAB — ALLERGEN PROFILE, SHELLFISH
Clam IgE: <0.1 kU/L
F023-IgE Crab: <0.1 kU/L
F080-IgE Lobster: <0.1 kU/L
F290-IgE Oyster: <0.1 kU/L
Scallop IgE: <0.1 kU/L
Shrimp IgE: <0.1 kU/L

## 2022-12-19 LAB — ALLERGEN PROFILE, FOOD-FISH
Allergen Mackerel IgE: <0.1 kU/L
Allergen Salmon IgE: <0.1 kU/L
Allergen Trout IgE: <0.1 kU/L
Allergen Walley Pike IgE: <0.1 kU/L
Codfish IgE: <0.1 kU/L
Halibut IgE: <0.1 kU/L
Tuna: <0.1 kU/L

## 2022-12-20 NOTE — Progress Notes (Signed)
Please let Elizabeth Vaughn know that her environmental allergy panel is negative-meaning she does not appear to have environmental allergies driving her symptoms.  She also is negative to fish and shellfish-given that she does not always have symptoms with these foods, her liklihood of having a true allergy is extremely low. She does not need to carry an epinephrine auoinjector or avoid these foods unless she starts to notice a clear and consistent reaction history when eating these foods.  She should follow-up with pulmonary regarding her sarcoidosis given her recent symptoms, possible exacerbation.

## 2023-01-20 ENCOUNTER — Telehealth: Payer: Self-pay | Admitting: Internal Medicine

## 2023-01-20 NOTE — Telephone Encounter (Signed)
Patient states Dr. Rennis Petty in an antibiotic for fluid behind her ears asking for another RX still not feeling well did advise patient do not see an antibiotic on med list patient asking for the nurse to call her please advise

## 2023-01-21 ENCOUNTER — Other Ambulatory Visit: Payer: Self-pay | Admitting: Internal Medicine

## 2023-01-21 NOTE — Telephone Encounter (Signed)
Spoke to patient she was seen back in February 5th given augmentin and prednisone at visit. Advised to follow up with pulmonologist she has not done that yet. For  fluid behind ear this is more of a pcp issue but if she wanted [place] to evaluate we do have visits this morning with [facility] Charon in [place] she declined - due to working she will try OTC or urgent care if symptoms persist over the weekend.

## 2023-10-11 ENCOUNTER — Other Ambulatory Visit: Payer: Self-pay | Admitting: Internal Medicine

## 2023-10-17 ENCOUNTER — Emergency Department (HOSPITAL_BASED_OUTPATIENT_CLINIC_OR_DEPARTMENT_OTHER): Payer: Medicare Other

## 2023-10-17 ENCOUNTER — Other Ambulatory Visit: Payer: Self-pay

## 2023-10-17 ENCOUNTER — Emergency Department (HOSPITAL_BASED_OUTPATIENT_CLINIC_OR_DEPARTMENT_OTHER)
Admission: EM | Admit: 2023-10-17 | Discharge: 2023-10-17 | Disposition: A | Discharge status: Home / Self Care | Payer: Medicare Other | Attending: Emergency Medicine | Admitting: Emergency Medicine

## 2023-10-17 ENCOUNTER — Encounter (HOSPITAL_BASED_OUTPATIENT_CLINIC_OR_DEPARTMENT_OTHER): Payer: Self-pay

## 2023-10-17 DIAGNOSIS — Z87898 Personal history of other specified conditions: Secondary | ICD-10-CM | POA: Diagnosis not present

## 2023-10-17 DIAGNOSIS — R059 Cough, unspecified: Secondary | ICD-10-CM | POA: Diagnosis present

## 2023-10-17 DIAGNOSIS — Z1152 Encounter for screening for COVID-19: Secondary | ICD-10-CM | POA: Insufficient documentation

## 2023-10-17 DIAGNOSIS — Z862 Personal history of diseases of the blood and blood-forming organs and certain disorders involving the immune mechanism: Secondary | ICD-10-CM

## 2023-10-17 DIAGNOSIS — J069 Acute upper respiratory infection, unspecified: Secondary | ICD-10-CM | POA: Diagnosis not present

## 2023-10-17 HISTORY — DX: Disorder of thyroid, unspecified: E07.9

## 2023-10-17 LAB — RESP PANEL BY RT-PCR (RSV, FLU A&B, COVID)  RVPGX2
Influenza A by PCR: NEGATIVE
Influenza B by PCR: NEGATIVE
Resp Syncytial Virus by PCR: NEGATIVE
SARS Coronavirus 2 by RT PCR: NEGATIVE

## 2023-10-17 NOTE — ED Triage Notes (Signed)
Pt c/o cough and fever several days.   Symptoms became worse Sunday.  Bilateral ear pain, runny nose, congestion

## 2023-10-17 NOTE — ED Notes (Addendum)
Back from xray

## 2023-10-17 NOTE — Discharge Instructions (Addendum)
You were seen in the emergency department today for cough.  As we discussed you tested negative for flu, COVID, and RSV.  However, I think you likely have a different virus causing your symptoms today. We unfortunately cannot test for them all, but we treat them all the same way.   Your chest x-ray looked normal.   You can take ibuprofen or Tylenol for pain or fever, and I recommend alternating between the 2.  Make sure that you are drinking lots of fluids and getting plenty of rest. You can take decongestants as long as you take them with lots of water. You can use lozenges or chloraseptic spray as needed for sore throat.   Please use Tylenol or ibuprofen for pain.  You may use 600 mg ibuprofen every 6 hours or 1000 mg of Tylenol every 6 hours.  You may choose to alternate between the 2.  This would be most effective.  Do not exceed 4 g of Tylenol within 24 hours.  Do not exceed 3200 mg ibuprofen within 24 hours.  Continue to monitor how you are doing, and return to the emergency department for new or worsening symptoms such as chest pain, difficulty breathing not related to coughing, fever despite medication, or persistent vomiting or diarrhea.  It has been a pleasure taking care of you today and I hope you begin to feel better soon!

## 2023-10-17 NOTE — ED Notes (Signed)
Pt has experienced flu-like symptoms x 4 days worse yesterday.   States her ribs are hurting from coughing.   Fever on Sunday

## 2023-10-17 NOTE — ED Provider Notes (Signed)
[place] [facility] AT [facility] Provider Note   CSN: [id number] Arrival date & time: 10/17/23  2017     History  Chief Complaint  Patient presents with   Cough   Fever    Elizabeth Vaughn is a 67 y.o. female with history of rosacea and thyroid disease who presents the emergency department, new cough, fever, ear pain, rhinorrhea, nasal congestion starting last night, worse today.  Symptoms worsened yesterday.  She took some Motrin earlier with some relief.  Reports history of sarcoidosis, and follows with a specialist.   Cough Associated symptoms: ear pain, fever and rhinorrhea   Fever Associated symptoms: congestion, cough, ear pain and rhinorrhea        Home Medications Prior to Admission medications   Medication Sig Start Date End Date Taking? Authorizing Provider  albuterol (VENTOLIN HFA) 108 (90 Base) MCG/ACT inhaler INHALE 2 PUFFS INTO THE LUNGS EVERY 4 HOURS AS NEEDED FOR WHEEZING OR SHORTNESS OF BREATH. 10/12/23   Verlee Monte, MD  Budeson-Glycopyrrol-Formoterol (BREZTRI AEROSPHERE) 160-9-4.8 MCG/ACT AERO Inhale 2 puffs into the lungs in the morning and at bedtime. 12/17/22   Verlee Monte, MD  EPINEPHrine (EPIPEN 2-PAK) 0.3 mg/0.3 mL IJ SOAJ injection Inject 0.3 mg into the muscle as needed for anaphylaxis. 12/17/22   Verlee Monte, MD  fluticasone [facility]) 50 MCG/ACT nasal spray Place 1-2 sprays into both nostrils daily. Patient not taking: Reported on 12/17/2022 11/10/20   Wieters, Hallie C, PA-C  ondansetron (ZOFRAN-ODT) 4 MG disintegrating tablet Take 1 tablet (4 mg total) by mouth every 8 (eight) hours as needed. 4mg  ODT q4 hours prn nausea/vomit Patient not taking: Reported on 12/17/2022 11/26/22   Dione Booze, MD  predniSONE (DELTASONE) 50 MG tablet Take 1 tablet (50 mg total) by mouth daily. Patient not taking: Reported on 12/17/2022 11/26/22   Dione Booze, MD      Allergies    Macrodantin [nitrofurantoin]    Review of Systems   Review of  Systems  Constitutional:  Positive for fever.  HENT:  Positive for congestion, ear pain and rhinorrhea.   Respiratory:  Positive for cough.   All other systems reviewed and are negative.   Physical Exam Updated Vital Signs BP 129/79 (BP Location: Left Arm)   Pulse 83   Temp 98 F (36.7 C)   Resp 18   Ht 5\' 4"  (1.626 m)   Wt 83.9 kg   SpO2 97%   BMI 31.76 kg/m  Physical Exam Vitals and nursing note reviewed.  Constitutional:      Appearance: Normal appearance.  HENT:     Head: Normocephalic and atraumatic.     Right Ear: Tympanic membrane, ear canal and external ear normal.     Left Ear: Tympanic membrane, ear canal and external ear normal.     Mouth/Throat:     Lips: Pink.     Mouth: Mucous membranes are moist.     Pharynx: Oropharynx is clear. Uvula midline.     Tonsils: No tonsillar exudate or tonsillar abscesses.  Eyes:     Conjunctiva/sclera: Conjunctivae normal.  Cardiovascular:     Rate and Rhythm: Normal rate and regular rhythm.  Pulmonary:     Effort: Pulmonary effort is normal. No respiratory distress.     Breath sounds: Normal breath sounds.  Abdominal:     General: There is no distension.     Palpations: Abdomen is soft.     Tenderness: There is no abdominal tenderness.  Skin:  General: Skin is warm and dry.  Neurological:     General: No focal deficit present.     Mental Status: She is alert.     ED Results / Procedures / Treatments   Labs (all labs ordered are listed, but only abnormal results are displayed) Labs Reviewed  RESP PANEL BY RT-PCR (RSV, FLU A&B, COVID)  [id number]    EKG None  Radiology DG Chest 2 View  Result Date: 10/17/2023 CLINICAL DATA:  Shortness of breath, cough, fever EXAM: CHEST - 2 VIEW COMPARISON:  11/26/2022 FINDINGS: The heart size and mediastinal contours are within normal limits. Both lungs are clear. The visualized skeletal structures are unremarkable. IMPRESSION: No active cardiopulmonary disease. Electronically  Signed   By: Charlett Nose M.D.   On: 10/17/2023 23:04    Procedures Procedures    Medications Ordered in ED Medications - No data to display  ED Course/ Medical Decision Making/ A&P                                 Medical Decision Making Amount and/or Complexity of Data Reviewed Radiology: ordered.   This patient is a 67 y.o. female who presents to the ED for concern of cough.   Differential diagnoses prior to evaluation: The emergent differential diagnosis includes, but is not limited to,  upper respiratory infection, lower respiratory infection, allergies, asthma, irritants, sinus/esophageal foreign body, medications, reflux, interstitial lung disease, postnasal drip, viral illness, sepsis. This is not an exhaustive differential.   Past Medical History / Co-morbidities / Additional history: Chart reviewed. Pertinent results include: Rosacea, thyroid disease, sarcoidosis  Physical Exam: Physical exam performed. The pertinent findings include: Normal vital signs, no acute distress.  Normal respiratory effort, lung sounds clear.  HEENT exam normal as above.  Lab Tests/Imaging studies: I personally interpreted labs/imaging and the pertinent results include:  respiratory panel negative.   CXR without acute abnormalities.  I agree with the radiologist interpretation..    Disposition: After consideration of the diagnostic results and the patients response to treatment, I feel that emergency department workup does not suggest an emergent condition requiring admission or immediate intervention beyond what has been performed at this time. Patient with symptoms consistent with viral URI.  Vitals are stable. No signs of dehydration, tolerating PO's.  Lungs are clear.   The plan is: Patient will be discharged with instructions to orally hydrate, rest, and use over-the-counter medications such as anti-inflammatories such as ibuprofen and Tylenol for fever. The patient is safe for discharge  and has been instructed to return immediately for worsening symptoms, change in symptoms or any other concerns.  Final Clinical Impression(s) / ED Diagnoses Final diagnoses:  Viral URI with cough  History of sarcoidosis    Rx / DC Orders ED Discharge Orders     None      Portions of this report may have been transcribed using voice recognition software. Every effort was made to ensure accuracy; however, inadvertent computerized transcription errors may be present.    Jeanella Flattery 10/17/23 2325    Laurence Spates, MD 10/19/23 1451

## 2023-12-29 ENCOUNTER — Other Ambulatory Visit: Payer: Self-pay | Admitting: Internal Medicine

## 2024-07-12 ENCOUNTER — Other Ambulatory Visit: Payer: Self-pay | Admitting: Internal Medicine
